# Patient Record
Sex: Female | Born: 1990 | Smoking: Never smoker
Health system: Southern US, Community
[De-identification: ages and names within clinical notes are randomized; demographics above are authoritative.]

## PROBLEM LIST (undated history)

## (undated) DIAGNOSIS — Z789 Other specified health status: Secondary | ICD-10-CM

## (undated) HISTORY — DX: Other specified health status: Z78.9

---

## 2015-05-25 NOTE — L&D Delivery Note (Signed)
Delivery Note At 8:48 PM a viable female was delivered via  (Presentation:vertex ; LOA ).  APGAR:8 ,9 ; weight  .   Placenta status:spont ,via shultz .  Cord 3vc:  with the following complications:none .  Cord pH: n/a  Anesthesia: none  Episiotomy: none  Lacerations:  none Suture Repair: n/a Est. Blood Loss 50 (mL):    Mom to postpartum.  Baby to Couplet care / Skin to Skin.  Emily Best 05/23/2016, 8:55 PM

## 2016-02-09 DIAGNOSIS — Z3493 Encounter for supervision of normal pregnancy, unspecified, third trimester: Secondary | ICD-10-CM | POA: Insufficient documentation

## 2016-02-18 ENCOUNTER — Encounter: Payer: Self-pay | Admitting: Family Medicine

## 2016-02-18 ENCOUNTER — Ambulatory Visit (INDEPENDENT_AMBULATORY_CARE_PROVIDER_SITE_OTHER): Payer: Self-pay | Admitting: Family Medicine

## 2016-02-18 VITALS — BP 101/58 | HR 72 | Ht 70.0 in | Wt 145.0 lb

## 2016-02-18 DIAGNOSIS — Z3482 Encounter for supervision of other normal pregnancy, second trimester: Secondary | ICD-10-CM

## 2016-02-18 DIAGNOSIS — Z3492 Encounter for supervision of normal pregnancy, unspecified, second trimester: Secondary | ICD-10-CM

## 2016-02-18 LAB — POCT URINALYSIS DIP (DEVICE)
BILIRUBIN URINE: NEGATIVE
GLUCOSE, UA: NEGATIVE mg/dL
Hgb urine dipstick: NEGATIVE
KETONES UR: NEGATIVE mg/dL
LEUKOCYTES UA: NEGATIVE
Nitrite: NEGATIVE
Protein, ur: NEGATIVE mg/dL
SPECIFIC GRAVITY, URINE: 1.025 (ref 1.005–1.030)
Urobilinogen, UA: 0.2 mg/dL (ref 0.0–1.0)
pH: 6.5 (ref 5.0–8.0)

## 2016-02-18 LAB — CBC
HEMATOCRIT: 32.6 % — AB (ref 35.0–45.0)
Hemoglobin: 10.6 g/dL — ABNORMAL LOW (ref 11.7–15.5)
MCH: 28.4 pg (ref 27.0–33.0)
MCHC: 32.5 g/dL (ref 32.0–36.0)
MCV: 87.4 fL (ref 80.0–100.0)
MPV: 10.3 fL (ref 7.5–12.5)
PLATELETS: 175 10*3/uL (ref 140–400)
RBC: 3.73 MIL/uL — ABNORMAL LOW (ref 3.80–5.10)
RDW: 13.1 % (ref 11.0–15.0)
WBC: 5.1 10*3/uL (ref 3.8–10.8)

## 2016-02-18 MED ORDER — PREPLUS 27-1 MG PO TABS: 1.0000 | ORAL_TABLET | Freq: Every day | ORAL | 13 refills | Status: DC

## 2016-02-18 NOTE — Addendum Note (Signed)
Addended by: Levie HeritageSTINSON, Albaro Deviney J on: 02/18/2016 11:28 AM   Modules accepted: Orders

## 2016-02-18 NOTE — Progress Notes (Addendum)
   PRENATAL VISIT NOTE  Subjective:  Emily Best is a 25 y.o. G3P2001 at 2145w2d being seen today for initial prenatal care.  She initially had [facility] in [place].  We have records, which were reviewed. Do not have PNL. Last visit there was around 20 weeks - did not have fetal survey. She is currently monitored for the following issues for this low-risk pregnancy and has Supervision of normal first pregnancy on her problem list.  Patient reports no complaints.  Contractions: Irritability. Vag. Bleeding: None.  Movement: Present. Denies leaking of fluid.   The following portions of the patient's history were reviewed and updated as appropriate: allergies, current medications, past family history, past medical history, past social history, past surgical history and problem list. Problem list updated.  Objective:   Vitals:   02/18/16 0918 02/18/16 0919  BP: (!) 101/58   Pulse: 72   Weight: 145 lb (65.8 kg)   Height:  5\' 10"  (1.778 m)    Fetal Status: Fetal Heart Rate (bpm): 143 Fundal Height: 27 cm Movement: Present     General:  Alert, oriented and cooperative. Patient is in no acute distress.  Skin: Skin is warm and dry. No rash noted.   Cardiovascular: Normal heart rate noted  Respiratory: Normal respiratory effort, no problems with respiration noted  Abdomen: Soft, gravid, appropriate for gestational age. Pain/Pressure: Present     Pelvic:  Cervical exam deferred        Extremities: Normal range of motion.  Edema: None  Mental Status: Normal mood and affect. Normal behavior. Normal judgment and thought content.   Urinalysis:      Assessment and Plan:  Pregnancy: G3P2001 at 1245w2d  1. Supervision of normal pregnancy in second trimester FHT and FH normal.  Fetal survey ordered. Attempting to get lab report for PNL. If unable to  - CBC - RPR - HIV antibody (with reflex) - Glucose Tolerance, 1 HR (50g) w/o Fasting - US MFM OB COMP + 14 WK; Future  Preterm labor symptoms and  general obstetric precautions including but not limited to vaginal bleeding, contractions, leaking of fluid and fetal movement were reviewed in detail with the patient. Please refer to After Visit Summary for other counseling recommendations.  Return in about 4 weeks (around 03/17/2016) for OB f/u.  Levie HeritageJacob J Ashland Wiseman, DO

## 2016-02-19 LAB — GLUCOSE TOLERANCE, 1 HOUR (50G) W/O FASTING: GLUCOSE, 1 HR, GESTATIONAL: 96 mg/dL (ref <140)

## 2016-02-19 LAB — HIV ANTIBODY (ROUTINE TESTING W REFLEX): HIV 1&2 Ab, 4th Generation: NONREACTIVE

## 2016-02-20 LAB — RPR

## 2016-02-23 ENCOUNTER — Other Ambulatory Visit: Payer: Self-pay | Admitting: Family Medicine

## 2016-02-23 ENCOUNTER — Ambulatory Visit (HOSPITAL_COMMUNITY)
Admission: RE | Admit: 2016-02-23 | Discharge: 2016-02-23 | Disposition: A | Discharge status: Home / Self Care | Payer: Self-pay | Source: Ambulatory Visit | Attending: Family Medicine | Admitting: Family Medicine

## 2016-02-23 DIAGNOSIS — Z3A28 28 weeks gestation of pregnancy: Secondary | ICD-10-CM

## 2016-02-23 DIAGNOSIS — O0933 Supervision of pregnancy with insufficient antenatal care, third trimester: Secondary | ICD-10-CM

## 2016-02-23 DIAGNOSIS — Z363 Encounter for antenatal screening for malformations: Secondary | ICD-10-CM

## 2016-02-23 DIAGNOSIS — Z3492 Encounter for supervision of normal pregnancy, unspecified, second trimester: Secondary | ICD-10-CM

## 2016-03-17 ENCOUNTER — Ambulatory Visit (INDEPENDENT_AMBULATORY_CARE_PROVIDER_SITE_OTHER): Payer: Self-pay | Admitting: Advanced Practice Midwife

## 2016-03-17 DIAGNOSIS — Z3403 Encounter for supervision of normal first pregnancy, third trimester: Secondary | ICD-10-CM

## 2016-03-17 LAB — POCT URINALYSIS DIP (DEVICE)
Bilirubin Urine: NEGATIVE
Glucose, UA: NEGATIVE mg/dL
HGB URINE DIPSTICK: NEGATIVE
Ketones, ur: NEGATIVE mg/dL
Leukocytes, UA: NEGATIVE
Nitrite: NEGATIVE
PH: 5.5 (ref 5.0–8.0)
PROTEIN: NEGATIVE mg/dL
UROBILINOGEN UA: 0.2 mg/dL (ref 0.0–1.0)

## 2016-03-17 NOTE — Patient Instructions (Signed)

## 2016-03-17 NOTE — Progress Notes (Signed)
States she doesn't think she is having contractions, but is having pressure and some pains in her pelvis. Called and also faxed release of records from previous provider.

## 2016-03-21 NOTE — Progress Notes (Signed)
   PRENATAL VISIT NOTE  Subjective:  Emily Best is a 25 y.o. G3P2001 at 163w2d being seen today for ongoing prenatal care.  She is currently monitored for the following issues for this low-risk pregnancy and has Supervision of normal first pregnancy on her problem list.  Patient reports pelvic pressure. Denies contractions. .  Contractions: Not present. Vag. Bleeding: None.  Movement: Present. Denies leaking of fluid.   The following portions of the patient's history were reviewed and updated as appropriate: allergies, current medications, past family history, past medical history, past social history, past surgical history and problem list. Problem list updated.  Objective:   Vitals:   03/17/16 1108  BP: 101/60  Pulse: 77  Weight: 150 lb (68 kg)    Fetal Status: Fetal Heart Rate (bpm): 134   Movement: Present     General:  Alert, oriented and cooperative. Patient is in no acute distress.  Skin: Skin is warm and dry. No rash noted.   Cardiovascular: Normal heart rate noted  Respiratory: Normal respiratory effort, no problems with respiration noted  Abdomen: Soft, gravid, appropriate for gestational age. Pain/Pressure: Present     Pelvic:  Declined        Extremities: Normal range of motion.  Edema: None  Mental Status: Normal mood and affect. Normal behavior. Normal judgment and thought content.   Assessment and Plan:  Pregnancy: G3P2001 at 637w6d  1. Encounter for supervision of normal first pregnancy in third trimester   Preterm labor symptoms and general obstetric precautions including but not limited to vaginal bleeding, contractions, leaking of fluid and fetal movement were reviewed in detail with the patient. Please refer to After Visit Summary for other counseling recommendations.  F/U 2 weeks  Dorathy KinsmanVirginia Charika Mikelson, [place]

## 2016-04-02 ENCOUNTER — Ambulatory Visit (INDEPENDENT_AMBULATORY_CARE_PROVIDER_SITE_OTHER): Payer: Self-pay | Admitting: Family Medicine

## 2016-04-02 VITALS — BP 102/56 | HR 79 | Wt 156.1 lb

## 2016-04-02 DIAGNOSIS — Z3403 Encounter for supervision of normal first pregnancy, third trimester: Secondary | ICD-10-CM

## 2016-04-02 NOTE — Addendum Note (Signed)
Addended by: Faythe CasaBELLAMY, JEANETTA M on: 04/02/2016 12:03 PM   Modules accepted: Orders

## 2016-04-02 NOTE — Progress Notes (Signed)
   PRENATAL VISIT NOTE  Subjective:  Emily BoschDominique Best is a 25 y.o. G3P2001 at 409w4d being seen today for ongoing prenatal care.  She is currently monitored for the following issues for this low-risk pregnancy and has Supervision of normal first pregnancy on her problem list.  Patient reports heartburn.  Contractions: Not present. Vag. Bleeding: None.  Movement: Present. Denies leaking of fluid.   The following portions of the patient's history were reviewed and updated as appropriate: allergies, current medications, past family history, past medical history, past social history, past surgical history and problem list. Problem list updated.  Objective:   Vitals:   04/02/16 1110  BP: (!) 102/56  Pulse: 79  Weight: 156 lb 1.6 oz (70.8 kg)    Fetal Status: Fetal Heart Rate (bpm): 135 Fundal Height: 34 cm Movement: Present     General:  Alert, oriented and cooperative. Patient is in no acute distress.  Skin: Skin is warm and dry. No rash noted.   Cardiovascular: Normal heart rate noted  Respiratory: Normal respiratory effort, no problems with respiration noted  Abdomen: Soft, gravid, appropriate for gestational age. Pain/Pressure: Present     Pelvic:  Cervical exam deferred        Extremities: Normal range of motion.  Edema: None  Mental Status: Normal mood and affect. Normal behavior. Normal judgment and thought content.   Assessment and Plan:  Pregnancy: G3P2001 at 189w4d  1. Encounter for supervision of normal first pregnancy in third trimester FHT and FH normal. Recommended Tums or OTC acid reducers for heartburn. Will get PNL as don't have records.  Preterm labor symptoms and general obstetric precautions including but not limited to vaginal bleeding, contractions, leaking of fluid and fetal movement were reviewed in detail with the patient. Please refer to After Visit Summary for other counseling recommendations.  Return in about 2 weeks (around 04/16/2016).   Levie HeritageJacob J Yesenia Locurto,  DO

## 2016-04-03 LAB — ABO

## 2016-04-03 LAB — HEPATITIS B SURFACE ANTIGEN: Hepatitis B Surface Ag: NEGATIVE

## 2016-04-03 LAB — ANTIBODY SCREEN: Antibody Screen: NEGATIVE

## 2016-04-03 LAB — RH TYPE: Rh Type: POSITIVE

## 2016-04-06 LAB — HEMOGLOBINOPATHY EVALUATION
HEMATOCRIT: 32.5 % — AB (ref 35.0–45.0)
HEMOGLOBIN: 10.5 g/dL — AB (ref 11.7–15.5)
Hgb A2 Quant: 2.3 % (ref 1.8–3.5)
Hgb A: 96.7 % (ref >96.0)
Hgb F Quant: <1 % (ref <2.0)
MCH: 28.1 pg (ref 27.0–33.0)
MCV: 86.9 fL (ref 80.0–100.0)
RBC: 3.74 MIL/uL — AB (ref 3.80–5.10)
RDW: 13.5 % (ref 11.0–15.0)

## 2016-04-20 ENCOUNTER — Ambulatory Visit (INDEPENDENT_AMBULATORY_CARE_PROVIDER_SITE_OTHER): Payer: Self-pay | Admitting: Student

## 2016-04-20 VITALS — BP 102/57 | HR 84 | Wt 157.8 lb

## 2016-04-20 DIAGNOSIS — Z3493 Encounter for supervision of normal pregnancy, unspecified, third trimester: Secondary | ICD-10-CM

## 2016-04-20 DIAGNOSIS — Z3483 Encounter for supervision of other normal pregnancy, third trimester: Secondary | ICD-10-CM

## 2016-04-20 DIAGNOSIS — Z113 Encounter for screening for infections with a predominantly sexual mode of transmission: Secondary | ICD-10-CM

## 2016-04-20 LAB — OB RESULTS CONSOLE GBS: GBS: POSITIVE

## 2016-04-20 LAB — OB RESULTS CONSOLE GC/CHLAMYDIA: GC PROBE AMP, GENITAL: NEGATIVE

## 2016-04-20 NOTE — Patient Instructions (Signed)
Introduction Patient Name: ________________________________________________ Patient Due Date: ____________________ What is a fetal movement count? A fetal movement count is the number of times that you feel your baby move during a certain amount of time. This may also be called a fetal kick count. A fetal movement count is recommended for every pregnant woman. You may be asked to start counting fetal movements as early as week 28 of your pregnancy. Pay attention to when your baby is most active. You may notice your baby's sleep and wake cycles. You may also notice things that make your baby move more. You should do a fetal movement count:  When your baby is normally most active.  At the same time each day. A good time to count movements is while you are resting, after having something to eat and drink. How do I count fetal movements? 1. Find a quiet, comfortable area. Sit, or lie down on your side. 2. Write down the date, the start time and stop time, and the number of movements that you felt between those two times. Take this information with you to your health care visits. 3. For 2 hours, count kicks, flutters, swishes, rolls, and jabs. You should feel at least 10 movements during 2 hours. 4. You may stop counting after you have felt 10 movements. 5. If you do not feel 10 movements in 2 hours, have something to eat and drink. Then, keep resting and counting for 1 hour. If you feel at least 4 movements during that hour, you may stop counting. Contact a health care provider if:  You feel fewer than 4 movements in 2 hours.  Your baby is not moving like he or she usually does. Date: ____________ Start time: ____________ Stop time: ____________ Movements: ____________ Date: ____________ Start time: ____________ Stop time: ____________ Movements: ____________ Date: ____________ Start time: ____________ Stop time: ____________ Movements: ____________ Date: ____________ Start time: ____________  Stop time: ____________ Movements: ____________ Date: ____________ Start time: ____________ Stop time: ____________ Movements: ____________ Date: ____________ Start time: ____________ Stop time: ____________ Movements: ____________ Date: ____________ Start time: ____________ Stop time: ____________ Movements: ____________ Date: ____________ Start time: ____________ Stop time: ____________ Movements: ____________ Date: ____________ Start time: ____________ Stop time: ____________ Movements: ____________ This information is not intended to replace advice given to you by your health care provider. Make sure you discuss any questions you have with your health care provider. Document Released: 06/09/2006 Document Revised: 01/07/2016 Document Reviewed: 06/19/2015 Elsevier Interactive Patient Education  2017 Elsevier Inc.  

## 2016-04-20 NOTE — Addendum Note (Signed)
Addended by: Gerome ApleyZEYFANG, LINDA L on: 04/20/2016 03:53 PM   Modules accepted: Orders

## 2016-04-20 NOTE — Progress Notes (Signed)
   PRENATAL VISIT NOTE  Subjective:  Emily Best is a 25 y.o. G3P2001 at 406w1d being seen today for ongoing prenatal care.  She is currently monitored for the following issues for this low-risk pregnancy and has Encounter for supervision of normal pregnancy in third trimester on her problem list.  Patient reports no complaints.  Contractions: Not present. Vag. Bleeding: None.  Movement: Present. Denies leaking of fluid.   The following portions of the patient's history were reviewed and updated as appropriate: allergies, current medications, past family history, past medical history, past social history, past surgical history and problem list. Problem list updated.  Objective:   Vitals:   04/20/16 1437  BP: (!) 102/57  Pulse: 84  Weight: 71.6 kg (157 lb 12.8 oz)    Fetal Status: Fetal Heart Rate (bpm): 140   Movement: Present     General:  Alert, oriented and cooperative. Patient is in no acute distress.  Skin: Skin is warm and dry. No rash noted.   Cardiovascular: Normal heart rate noted  Respiratory: Normal respiratory effort, no problems with respiration noted  Abdomen: Soft, gravid, appropriate for gestational age. Pain/Pressure: Absent     Pelvic:  Cervical exam performed        Extremities: Normal range of motion.  Edema: None  Mental Status: Normal mood and affect. Normal behavior. Normal judgment and thought content.   Assessment and Plan:  Pregnancy: G3P2001 at 326w1d  1. Prenatal care in third trimester  - GC/Chlamydia Probe Amp - Culture, beta strep (group b only) - Rubella Antibody, IGM - Culture, OB Urine  2. Encounter for supervision of other normal pregnancy in third trimester Cervix is soft, closed and fetal head is ballotable. Reviewed fetal movement and when to come to MAU.   Term labor symptoms and general obstetric precautions including but not limited to vaginal bleeding, contractions, leaking of fluid and fetal movement were reviewed in detail with  the patient. Please refer to After Visit Summary for other counseling recommendations.  Return in about 1 week (around 04/27/2016).   Marylene LandKathryn Lorraine Kooistra, CNM

## 2016-04-21 LAB — GC/CHLAMYDIA PROBE AMP (~~LOC~~) NOT AT ARMC
Chlamydia: NEGATIVE
Neisseria Gonorrhea: NEGATIVE

## 2016-04-22 LAB — CULTURE, OB URINE
COLONY COUNT: NO GROWTH
Organism ID, Bacteria: NO GROWTH

## 2016-04-22 LAB — CULTURE, BETA STREP (GROUP B ONLY)

## 2016-04-28 ENCOUNTER — Encounter: Payer: Self-pay | Admitting: Medical

## 2016-05-05 ENCOUNTER — Ambulatory Visit (INDEPENDENT_AMBULATORY_CARE_PROVIDER_SITE_OTHER): Payer: Self-pay | Admitting: Medical

## 2016-05-05 VITALS — BP 116/64 | HR 89 | Wt 159.6 lb

## 2016-05-05 DIAGNOSIS — Z3493 Encounter for supervision of normal pregnancy, unspecified, third trimester: Secondary | ICD-10-CM

## 2016-05-05 NOTE — Progress Notes (Signed)
   PRENATAL VISIT NOTE  Subjective:  Emily Best is a 25 y.o. G3P2001 at 254w2d being seen today for ongoing prenatal care.  She is currently monitored for the following issues for this low-risk pregnancy and has Encounter for supervision of normal pregnancy in third trimester on her problem list.  Patient reports no complaints.  Contractions: Not present. Vag. Bleeding: None.  Movement: Present. Denies leaking of fluid.   The following portions of the patient's history were reviewed and updated as appropriate: allergies, current medications, past family history, past medical history, past social history, past surgical history and problem list. Problem list updated.  Objective:   Vitals:   05/05/16 1132  BP: 116/64  Pulse: 89  Weight: 159 lb 9.6 oz (72.4 kg)    Fetal Status: Fetal Heart Rate (bpm): 135 Fundal Height: 38 cm Movement: Present  Presentation: Vertex  General:  Alert, oriented and cooperative. Patient is in no acute distress.  Skin: Skin is warm and dry. No rash noted.   Cardiovascular: Normal heart rate noted  Respiratory: Normal respiratory effort, no problems with respiration noted  Abdomen: Soft, gravid, appropriate for gestational age. Pain/Pressure: Present     Pelvic:  Cervical exam performed Dilation: 1 Effacement (%): 50 Station: -3  Extremities: Normal range of motion.  Edema: None  Mental Status: Normal mood and affect. Normal behavior. Normal judgment and thought content.   Assessment and Plan:  Pregnancy: G3P2001 at 514w2d  1. Supervision of low-risk pregnancy, third trimester - Rubella Antibody, IGM (not obtained at previous visits) - Discussed + GBS status and need for antibiotics in labor  Term labor symptoms and general obstetric precautions including but not limited to vaginal bleeding, contractions, leaking of fluid and fetal movement were reviewed in detail with the patient. Please refer to After Visit Summary for other counseling  recommendations.  Return in about 1 week (around 05/12/2016) for LOB.   Marny LowensteinJulie N Makylie Rivere, PA-C

## 2016-05-05 NOTE — Patient Instructions (Signed)
Introduction Patient Name: ________________________________________________ Patient Due Date: ____________________ What is a fetal movement count? A fetal movement count is the number of times that you feel your baby move during a certain amount of time. This may also be called a fetal kick count. A fetal movement count is recommended for every pregnant woman. You may be asked to start counting fetal movements as early as week 28 of your pregnancy. Pay attention to when your baby is most active. You may notice your baby's sleep and wake cycles. You may also notice things that make your baby move more. You should do a fetal movement count:  When your baby is normally most active.  At the same time each day. A good time to count movements is while you are resting, after having something to eat and drink. How do I count fetal movements? 1. Find a quiet, comfortable area. Sit, or lie down on your side. 2. Write down the date, the start time and stop time, and the number of movements that you felt between those two times. Take this information with you to your health care visits. 3. For 2 hours, count kicks, flutters, swishes, rolls, and jabs. You should feel at least 10 movements during 2 hours. 4. You may stop counting after you have felt 10 movements. 5. If you do not feel 10 movements in 2 hours, have something to eat and drink. Then, keep resting and counting for 1 hour. If you feel at least 4 movements during that hour, you may stop counting. Contact a health care provider if:  You feel fewer than 4 movements in 2 hours.  Your baby is not moving like he or she usually does. Date: ____________ Start time: ____________ Stop time: ____________ Movements: ____________ Date: ____________ Start time: ____________ Stop time: ____________ Movements: ____________ Date: ____________ Start time: ____________ Stop time: ____________ Movements: ____________ Date: ____________ Start time: ____________  Stop time: ____________ Movements: ____________ Date: ____________ Start time: ____________ Stop time: ____________ Movements: ____________ Date: ____________ Start time: ____________ Stop time: ____________ Movements: ____________ Date: ____________ Start time: ____________ Stop time: ____________ Movements: ____________ Date: ____________ Start time: ____________ Stop time: ____________ Movements: ____________ Date: ____________ Start time: ____________ Stop time: ____________ Movements: ____________ This information is not intended to replace advice given to you by your health care provider. Make sure you discuss any questions you have with your health care provider. Document Released: 06/09/2006 Document Revised: 01/07/2016 Document Reviewed: 06/19/2015 Elsevier Interactive Patient Education  2017 Else[employer] Inc[employer] Braxton Hicks Contractions Contractions of the uterus can occur throughout pregnancy. Contractions are not always a sign that you are in labor.  WHAT ARE BRAXTON HICKS CONTRACTIONS?  Contractions that occur before labor are called Braxton Hicks contractions, or false labor. Toward the end of pregnancy (32-34 weeks), these contractions can develop more often and may become more forceful. This is not true labor because these contractions do not result in opening (dilatation) and thinning of the cervix. They are sometimes difficult to tell apart from true labor because these contractions can be forceful and people have different pain tolerances. You should not feel embarrassed if you go to the hospital with false labor. Sometimes, the only way to tell if you are in true labor is for your health care provider to look for changes in the cervix. If there are no prenatal problems or other health problems associated with the pregnancy, it is completely safe to be sent home with false labor and await the onset of true labor.   HOW CAN YOU TELL THE DIFFERENCE BETWEEN TRUE AND FALSE LABOR? False Labor     The contractions of false labor are usually shorter and not as hard as those of true labor.   The contractions are usually irregular.   The contractions are often felt in the front of the lower abdomen and in the groin.   The contractions may go away when you walk around or change positions while lying down.   The contractions get weaker and are shorter lasting as time goes on.   The contractions do not usually become progressively stronger, regular, and closer together as with true labor.  True Labor   Contractions in true labor last 30-70 seconds, become very regular, usually become more intense, and increase in frequency.   The contractions do not go away with walking.   The discomfort is usually felt in the top of the uterus and spreads to the lower abdomen and low back.   True labor can be determined by your health care provider with an exam. This will show that the cervix is dilating and getting thinner.  WHAT TO REMEMBER  Keep up with your usual exercises and follow other instructions given by your health care provider.   Take medicines as directed by your health care provider.   Keep your regular prenatal appointments.   Eat and drink lightly if you think you are going into labor.   If Braxton Hicks contractions are making you uncomfortable:   Change your position from lying down or resting to walking, or from walking to resting.   Sit and rest in a tub of warm water.   Drink 2-3 glasses of water. Dehydration may cause these contractions.   Do slow and deep breathing several times an hour.  WHEN SHOULD I SEEK IMMEDIATE MEDICAL CARE? Seek immediate medical care if:  Your contractions become stronger, more regular, and closer together.   You have fluid leaking or gushing from your vagina.   You have a fever.   You pass blood-tinged mucus.   You have vaginal bleeding.   You have continuous abdominal pain.   You have low back pain  that you never had before.   You feel your baby's head pushing down and causing pelvic pressure.   Your baby is not moving as much as it used to.  This information is not intended to replace advice given to you by your health care provider. Make sure you discuss any questions you have with your health care provider. Document Released: 05/10/2005 Document Revised: 09/01/2015 Document Reviewed: 02/19/2013 Elsevier Interactive Patient Education  2017 [employer] Inc.  

## 2016-05-08 LAB — RUBELLA ANTIBODY, IGM

## 2016-05-12 ENCOUNTER — Ambulatory Visit (INDEPENDENT_AMBULATORY_CARE_PROVIDER_SITE_OTHER): Payer: Self-pay | Admitting: Student

## 2016-05-12 VITALS — BP 97/60 | HR 73 | Wt 160.0 lb

## 2016-05-12 DIAGNOSIS — O9989 Other specified diseases and conditions complicating pregnancy, childbirth and the puerperium: Secondary | ICD-10-CM

## 2016-05-12 DIAGNOSIS — O09899 Supervision of other high risk pregnancies, unspecified trimester: Secondary | ICD-10-CM

## 2016-05-12 DIAGNOSIS — Z2839 Other underimmunization status: Secondary | ICD-10-CM | POA: Insufficient documentation

## 2016-05-12 DIAGNOSIS — Z283 Underimmunization status: Secondary | ICD-10-CM

## 2016-05-12 DIAGNOSIS — Z3483 Encounter for supervision of other normal pregnancy, third trimester: Secondary | ICD-10-CM

## 2016-05-12 NOTE — Patient Instructions (Signed)
Braxton Hicks Contractions °Contractions of the uterus can occur throughout pregnancy. Contractions are not always a sign that you are in labor.  °WHAT ARE BRAXTON HICKS CONTRACTIONS?  °Contractions that occur before labor are called Braxton Hicks contractions, or false labor. Toward the end of pregnancy (32-34 weeks), these contractions can develop more often and may become more forceful. This is not true labor because these contractions do not result in opening (dilatation) and thinning of the cervix. They are sometimes difficult to tell apart from true labor because these contractions can be forceful and people have different pain tolerances. You should not feel embarrassed if you go to the hospital with false labor. Sometimes, the only way to tell if you are in true labor is for your health care provider to look for changes in the cervix. °If there are no prenatal problems or other health problems associated with the pregnancy, it is completely safe to be sent home with false labor and await the onset of true labor. °HOW CAN YOU TELL THE DIFFERENCE BETWEEN TRUE AND FALSE LABOR? °False Labor  °· The contractions of false labor are usually shorter and not as hard as those of true labor.   °· The contractions are usually irregular.   °· The contractions are often felt in the front of the lower abdomen and in the groin.   °· The contractions may go away when you walk around or change positions while lying down.   °· The contractions get weaker and are shorter lasting as time goes on.   °· The contractions do not usually become progressively stronger, regular, and closer together as with true labor.   °True Labor  °· Contractions in true labor last 30-70 seconds, become very regular, usually become more intense, and increase in frequency.   °· The contractions do not go away with walking.   °· The discomfort is usually felt in the top of the uterus and spreads to the lower abdomen and low back.   °· True labor can be  determined by your health care provider with an exam. This will show that the cervix is dilating and getting thinner.   °WHAT TO REMEMBER °· Keep up with your usual exercises and follow other instructions given by your health care provider.   °· Take medicines as directed by your health care provider.   °· Keep your regular prenatal appointments.   °· Eat and drink lightly if you think you are going into labor.   °· If Braxton Hicks contractions are making you uncomfortable:   °¨ Change your position from lying down or resting to walking, or from walking to resting.   °¨ Sit and rest in a tub of warm water.   °¨ Drink 2-3 glasses of water. Dehydration may cause these contractions.   °¨ Do slow and deep breathing several times an hour.   °WHEN SHOULD I SEEK IMMEDIATE MEDICAL CARE? °Seek immediate medical care if: °· Your contractions become stronger, more regular, and closer together.   °· You have fluid leaking or gushing from your vagina.   °· You have a fever.   °· You pass blood-tinged mucus.   °· You have vaginal bleeding.   °· You have continuous abdominal pain.   °· You have low back pain that you never had before.   °· You feel your baby's head pushing down and causing pelvic pressure.   °· Your baby is not moving as much as it used to.   °This information is not intended to replace advice given to you by your health care provider. Make sure you discuss any questions you have with your health care   provider. °Document Released: 05/10/2005 Document Revised: 09/01/2015 Document Reviewed: 02/19/2013 °Elsevier Interactive Patient Education © 2017 [employer] ° °

## 2016-05-12 NOTE — Progress Notes (Signed)
   PRENATAL VISIT NOTE  Subjective:  Emily Best is a 25 y.o. G3P2001 at 6550w2d being seen today for ongoing prenatal care.  She is currently monitored for the following issues for this low-risk pregnancy and has Encounter for supervision of normal pregnancy in third trimester on her problem list.  Patient reports no complaints.  Contractions: Not present. Vag. Bleeding: None.  Movement: Present. Denies leaking of fluid.   The following portions of the patient's history were reviewed and updated as appropriate: allergies, current medications, past family history, past medical history, past social history, past surgical history and problem list. Problem list updated.  Objective:   Vitals:   05/12/16 1148  BP: 97/60  Pulse: 73  Weight: 160 lb (72.6 kg)    Fetal Status: Fetal Heart Rate (bpm): 142 Fundal Height: 39 cm Movement: Present     General:  Alert, oriented and cooperative. Patient is in no acute distress.  Skin: Skin is warm and dry. No rash noted.   Cardiovascular: Normal heart rate noted  Respiratory: Normal respiratory effort, no problems with respiration noted  Abdomen: Soft, gravid, appropriate for gestational age. Pain/Pressure: Absent     Pelvic:  Cervical exam performed        Extremities: Normal range of motion.  Edema: None  Mental Status: Normal mood and affect. Normal behavior. Normal judgment and thought content.   Assessment and Plan:  Pregnancy: G3P2001 at 5950w2d  1. Encounter for supervision of other normal pregnancy in third trimester Patient has no complaints, cervix is 50%, closed and ballotable.   Term labor symptoms and general obstetric precautions including but not limited to vaginal bleeding, contractions, leaking of fluid and fetal movement were reviewed in detail with the patient.  2. Patient is rubella non-immune, will need rubella vaccine post partum.   Please refer to After Visit Summary for other counseling recommendations.  No Follow-up  on file.   Marylene LandKathryn Lorraine Zalika Tieszen, CNM

## 2016-05-19 ENCOUNTER — Encounter: Payer: Self-pay | Admitting: Advanced Practice Midwife

## 2016-05-20 ENCOUNTER — Telehealth (HOSPITAL_COMMUNITY): Payer: Self-pay | Admitting: *Deleted

## 2016-05-20 ENCOUNTER — Encounter: Payer: Self-pay | Admitting: Obstetrics and Gynecology

## 2016-05-20 ENCOUNTER — Ambulatory Visit (INDEPENDENT_AMBULATORY_CARE_PROVIDER_SITE_OTHER): Payer: Self-pay | Admitting: Obstetrics and Gynecology

## 2016-05-20 ENCOUNTER — Ambulatory Visit: Payer: Self-pay

## 2016-05-20 VITALS — BP 106/67 | HR 81 | Wt 161.7 lb

## 2016-05-20 DIAGNOSIS — Z3689 Encounter for other specified antenatal screening: Secondary | ICD-10-CM

## 2016-05-20 DIAGNOSIS — O480 Post-term pregnancy: Secondary | ICD-10-CM

## 2016-05-20 DIAGNOSIS — O99820 Streptococcus B carrier state complicating pregnancy: Secondary | ICD-10-CM

## 2016-05-20 NOTE — Telephone Encounter (Signed)
Preadmission screen  

## 2016-05-20 NOTE — Progress Notes (Addendum)
Prenatal Visit Note Date: 05/20/2016 Clinic: [facility]  Subjective:  Emily BoschDominique Best is a 25 y.o. G3P2001 at 9448w3d being seen today for ongoing prenatal care.  She is currently monitored for the following issues for this low-risk pregnancy and has Encounter for supervision of normal pregnancy in third trimester; Rubella non-immune status, antepartum; and GBS (group B Streptococcus carrier), +RV culture, currently pregnant on her problem list.  Patient reports no complaints.   Contractions: Not present. Vag. Bleeding: None.  Movement: Present. Denies leaking of fluid.   The following portions of the patient's history were reviewed and updated as appropriate: allergies, current medications, past family history, past medical history, past social history, past surgical history and problem list. Problem list updated.  Objective:   Vitals:   05/20/16 1303  BP: 106/67  Pulse: 81  Weight: 161 lb 11.2 oz (73.3 kg)    Fetal Status: Fetal Heart Rate (bpm): 134 Fundal Height: 38 cm Movement: Present  Presentation: Vertex  General:  Alert, oriented and cooperative. Patient is in no acute distress.  Skin: Skin is warm and dry. No rash noted.   Cardiovascular: Normal heart rate noted  Respiratory: Normal respiratory effort, no problems with respiration noted  Abdomen: Soft, gravid, appropriate for gestational age. Pain/Pressure: Present     Pelvic:  2/50/-1/midposition/soft.   Extremities: Normal range of motion.  Edema: None  Mental Status: Normal mood and affect. Normal behavior. Normal judgment and thought content.   Urinalysis:      Assessment and Plan:  Pregnancy: G3P2001 at 5248w3d  1. GBS (group B Streptococcus carrier), +RV culture, currently pregnant tx in labor.  2. Post term pregnancy, antepartum condition or complication Set up for IOL on 12/31. Normal AFI and rNST x 365m (125 baseline, +accels, no decel, mod var, +UCs and irritability). Likely pitocin. Depo  provera  Term labor symptoms and general obstetric precautions including but not limited to vaginal bleeding, contractions, leaking of fluid and fetal movement were reviewed in detail with the patient. Please refer to After Visit Summary for other counseling recommendations.  Return in about 6 weeks (around 07/01/2016) for PP visit.    Bingharlie Ieisha Gao, MD

## 2016-05-20 NOTE — Progress Notes (Signed)
Pt informed that the ultrasound today is considered a limited OB ultrasound and is not intended to be a complete ultrasound exam.  Patient also informed that the ultrasound is not being completed with the intent of assessing for fetal or placental anomalies or any pelvic abnormalities.  Explained that the purpose of today's ultrasound is to assess for presentation and amniotic fluid volume.  Patient acknowledges the purpose of the exam and the limitations of the study.

## 2016-05-23 ENCOUNTER — Inpatient Hospital Stay (HOSPITAL_COMMUNITY)
Admission: RE | Admit: 2016-05-23 | Discharge: 2016-05-25 | DRG: 775 | Disposition: A | Discharge status: Home / Self Care | Payer: Self-pay | Source: Ambulatory Visit | Attending: Obstetrics and Gynecology | Admitting: Obstetrics and Gynecology

## 2016-05-23 ENCOUNTER — Encounter (HOSPITAL_COMMUNITY): Payer: Self-pay

## 2016-05-23 DIAGNOSIS — Z3A40 40 weeks gestation of pregnancy: Secondary | ICD-10-CM

## 2016-05-23 DIAGNOSIS — O99824 Streptococcus B carrier state complicating childbirth: Secondary | ICD-10-CM

## 2016-05-23 DIAGNOSIS — O480 Post-term pregnancy: Principal | ICD-10-CM | POA: Diagnosis present

## 2016-05-23 LAB — TYPE AND SCREEN
ABO/RH(D): O POS
ANTIBODY SCREEN: NEGATIVE

## 2016-05-23 LAB — CBC
HCT: 30.1 % — ABNORMAL LOW (ref 36.0–46.0)
Hemoglobin: 10 g/dL — ABNORMAL LOW (ref 12.0–15.0)
MCH: 27.3 pg (ref 26.0–34.0)
MCHC: 33.2 g/dL (ref 30.0–36.0)
MCV: 82.2 fL (ref 78.0–100.0)
PLATELETS: 175 10*3/uL (ref 150–400)
RBC: 3.66 MIL/uL — ABNORMAL LOW (ref 3.87–5.11)
RDW: 14 % (ref 11.5–15.5)
WBC: 6.4 10*3/uL (ref 4.0–10.5)

## 2016-05-23 LAB — RPR: RPR: NONREACTIVE

## 2016-05-23 LAB — ABO/RH: ABO/RH(D): O POS

## 2016-05-23 MED ORDER — FAMOTIDINE 20 MG PO TABS
20.0000 mg | ORAL_TABLET | Freq: Every day | ORAL | Status: DC
  Administered 2016-05-25: 20 mg via ORAL
  Filled 2016-05-23 (×2): qty 1

## 2016-05-23 MED ORDER — DIBUCAINE 1 % RE OINT: 1.0000 "application " | TOPICAL_OINTMENT | RECTAL | Status: DC | PRN

## 2016-05-23 MED ORDER — FENTANYL CITRATE (PF) 100 MCG/2ML IJ SOLN
50.0000 ug | INTRAMUSCULAR | Status: DC | PRN
  Administered 2016-05-23 (×2): 100 ug via INTRAVENOUS
  Filled 2016-05-23 (×2): qty 2

## 2016-05-23 MED ORDER — OXYCODONE-ACETAMINOPHEN 5-325 MG PO TABS: 1.0000 | ORAL_TABLET | ORAL | Status: DC | PRN

## 2016-05-23 MED ORDER — DIPHENHYDRAMINE HCL 25 MG PO CAPS: 25.0000 mg | ORAL_CAPSULE | Freq: Four times a day (QID) | ORAL | Status: DC | PRN

## 2016-05-23 MED ORDER — PENICILLIN G POT IN DEXTROSE 60000 UNIT/ML IV SOLN
3.0000 10*6.[IU] | INTRAVENOUS | Status: DC
  Administered 2016-05-23 (×2): 3 10*6.[IU] via INTRAVENOUS
  Filled 2016-05-23 (×4): qty 50

## 2016-05-23 MED ORDER — ACETAMINOPHEN 325 MG PO TABS: 650.0000 mg | ORAL_TABLET | ORAL | Status: DC | PRN

## 2016-05-23 MED ORDER — BENZOCAINE-MENTHOL 20-0.5 % EX AERO: 1.0000 "application " | INHALATION_SPRAY | CUTANEOUS | Status: DC | PRN

## 2016-05-23 MED ORDER — OXYTOCIN 40 UNITS IN LACTATED RINGERS INFUSION - SIMPLE MED
2.5000 [IU]/h | INTRAVENOUS | Status: DC
  Filled 2016-05-23: qty 1000

## 2016-05-23 MED ORDER — ONDANSETRON HCL 4 MG/2ML IJ SOLN: 4.0000 mg | Freq: Four times a day (QID) | INTRAMUSCULAR | Status: DC | PRN

## 2016-05-23 MED ORDER — OXYTOCIN BOLUS FROM INFUSION
500.0000 mL | Freq: Once | INTRAVENOUS | Status: AC
  Administered 2016-05-23: 500 mL via INTRAVENOUS

## 2016-05-23 MED ORDER — SODIUM CHLORIDE 0.9 % IV SOLN: 250.0000 mL | INTRAVENOUS | Status: DC | PRN

## 2016-05-23 MED ORDER — ONDANSETRON HCL 4 MG PO TABS: 4.0000 mg | ORAL_TABLET | ORAL | Status: DC | PRN

## 2016-05-23 MED ORDER — FLEET ENEMA 7-19 GM/118ML RE ENEM: 1.0000 | ENEMA | RECTAL | Status: DC | PRN

## 2016-05-23 MED ORDER — SODIUM CHLORIDE 0.9% FLUSH: 3.0000 mL | INTRAVENOUS | Status: DC | PRN

## 2016-05-23 MED ORDER — LIDOCAINE HCL (PF) 1 % IJ SOLN
30.0000 mL | INTRAMUSCULAR | Status: DC | PRN
  Filled 2016-05-23: qty 30

## 2016-05-23 MED ORDER — LACTATED RINGERS IV SOLN
INTRAVENOUS | Status: DC
  Administered 2016-05-23 (×2): 125 mL/h via INTRAVENOUS

## 2016-05-23 MED ORDER — TERBUTALINE SULFATE 1 MG/ML IJ SOLN: 0.2500 mg | Freq: Once | INTRAMUSCULAR | Status: DC | PRN

## 2016-05-23 MED ORDER — MEASLES, MUMPS & RUBELLA VAC ~~LOC~~ INJ
0.5000 mL | INJECTION | Freq: Once | SUBCUTANEOUS | Status: DC
  Filled 2016-05-23: qty 0.5

## 2016-05-23 MED ORDER — LACTATED RINGERS IV SOLN: 500.0000 mL | INTRAVENOUS | Status: DC | PRN

## 2016-05-23 MED ORDER — IBUPROFEN 600 MG PO TABS
600.0000 mg | ORAL_TABLET | Freq: Four times a day (QID) | ORAL | Status: DC
  Administered 2016-05-24 – 2016-05-25 (×8): 600 mg via ORAL
  Filled 2016-05-23 (×8): qty 1

## 2016-05-23 MED ORDER — ZOLPIDEM TARTRATE 5 MG PO TABS
5.0000 mg | ORAL_TABLET | Freq: Every evening | ORAL | Status: DC | PRN
Start: 2016-05-23 — End: 2016-05-25

## 2016-05-23 MED ORDER — TETANUS-DIPHTH-ACELL PERTUSSIS 5-2.5-18.5 LF-MCG/0.5 IM SUSP
0.5000 mL | Freq: Once | INTRAMUSCULAR | Status: DC
  Filled 2016-05-23: qty 0.5

## 2016-05-23 MED ORDER — DEXTROSE 5 % IV SOLN
5.0000 10*6.[IU] | Freq: Once | INTRAVENOUS | Status: AC
  Administered 2016-05-23: 5 10*6.[IU] via INTRAVENOUS
  Filled 2016-05-23: qty 5

## 2016-05-23 MED ORDER — SODIUM CHLORIDE 0.9% FLUSH: 3.0000 mL | Freq: Two times a day (BID) | INTRAVENOUS | Status: DC

## 2016-05-23 MED ORDER — ONDANSETRON HCL 4 MG/2ML IJ SOLN: 4.0000 mg | INTRAMUSCULAR | Status: DC | PRN

## 2016-05-23 MED ORDER — PRENATAL MULTIVITAMIN CH
1.0000 | ORAL_TABLET | Freq: Every day | ORAL | Status: DC
  Administered 2016-05-24 – 2016-05-25 (×2): 1 via ORAL
  Filled 2016-05-23 (×2): qty 1

## 2016-05-23 MED ORDER — OXYCODONE-ACETAMINOPHEN 5-325 MG PO TABS: 2.0000 | ORAL_TABLET | ORAL | Status: DC | PRN

## 2016-05-23 MED ORDER — COCONUT OIL OIL: 1.0000 "application " | TOPICAL_OIL | Status: DC | PRN

## 2016-05-23 MED ORDER — OXYTOCIN 40 UNITS IN LACTATED RINGERS INFUSION - SIMPLE MED
1.0000 m[IU]/min | INTRAVENOUS | Status: DC
  Administered 2016-05-23: 2 m[IU]/min via INTRAVENOUS

## 2016-05-23 MED ORDER — SENNOSIDES-DOCUSATE SODIUM 8.6-50 MG PO TABS
2.0000 | ORAL_TABLET | ORAL | Status: DC
  Administered 2016-05-23 – 2016-05-25 (×2): 2 via ORAL
  Filled 2016-05-23 (×2): qty 2

## 2016-05-23 MED ORDER — SIMETHICONE 80 MG PO CHEW
80.0000 mg | CHEWABLE_TABLET | ORAL | Status: DC | PRN
  Administered 2016-05-25: 80 mg via ORAL

## 2016-05-23 MED ORDER — SOD CITRATE-CITRIC ACID 500-334 MG/5ML PO SOLN
30.0000 mL | ORAL | Status: DC | PRN
Start: 2016-05-23 — End: 2016-05-23

## 2016-05-23 MED ORDER — WITCH HAZEL-GLYCERIN EX PADS: 1.0000 "application " | MEDICATED_PAD | CUTANEOUS | Status: DC | PRN

## 2016-05-23 NOTE — H&P (Signed)
Emily BoschDominique Krieger is a 25 y.o. female G3P2001 @ 40.6 wks  presenting for IOL for post dates. GBS pos. OB History    Gravida Para Term Preterm AB Living   3 2 2     1    SAB TAB Ectopic Multiple Live Births           2      Obstetric Comments   One child passed away remote from birth due to drowning.      Past Medical History:  Diagnosis Date  . Medical history non-contributory    History reviewed. No pertinent surgical history. Family History: family history is not on file. Social History:  reports that she has never smoked. She has never used smokeless tobacco. She reports that she does not drink alcohol or use drugs.     Maternal Diabetes: No Genetic Screening: Normal Maternal Ultrasounds/Referrals: Normal Fetal Ultrasounds or other Referrals:  None Maternal Substance Abuse:  No Significant Maternal Medications:  None Significant Maternal Lab Results:  Lab values include: Group B Strep positive Other Comments:  None  Review of Systems  Constitutional: Negative.   HENT: Negative.   Eyes: Negative.   Respiratory: Negative.   Cardiovascular: Negative.   Gastrointestinal: Negative.   Genitourinary: Negative.   Musculoskeletal: Negative.   Skin: Negative.   Neurological: Negative.   Endo/Heme/Allergies: Negative.   Psychiatric/Behavioral: Negative.    Maternal Medical History:  Reason for admission: Post dates  Fetal activity: Perceived fetal activity is normal.   Last perceived fetal movement was within the past hour.    Prenatal complications: no prenatal complications Prenatal Complications - Diabetes: none.    Dilation: Fingertip Effacement (%): Thick Station: -3 Exam by:: D Nic Lampe CNM Blood pressure 102/79, pulse 88, temperature 97.5 F (36.4 C), temperature source Oral, resp. rate 20, height 5\' 10"  (1.778 m), weight 161 lb (73 kg), last menstrual period 08/14/2015. Maternal Exam:  Abdomen: Patient reports no abdominal tenderness. Fetal presentation:  vertex  Introitus: Normal vulva. Normal vagina.  Pelvis: adequate for delivery.   Cervix: Cervix evaluated by digital exam.     Fetal Exam Fetal Monitor Review: Mode: ultrasound.   Variability: moderate (6-25 bpm).   Pattern: accelerations present.    Fetal State Assessment: Category I - tracings are normal.     Physical Exam  Constitutional: She is oriented to person, place, and time. She appears well-developed and well-nourished.  HENT:  Head: Normocephalic.  Eyes: Pupils are equal, round, and reactive to light.  Cardiovascular: Normal rate, regular rhythm, normal heart sounds and intact distal pulses.   Respiratory: Effort normal and breath sounds normal.  GI: Soft. Bowel sounds are normal.  Genitourinary: Vagina normal and uterus normal.  Musculoskeletal: Normal range of motion.  Neurological: She is alert and oriented to person, place, and time. She has normal reflexes.  Skin: Skin is warm and dry.  Psychiatric: She has a normal mood and affect. Her behavior is normal. Judgment and thought content normal.    Prenatal labs: ABO, Rh: O/POS/-- (11/10 1136) Antibody: NEG (11/10 1136) Rubella: < 20.00 (12/13 1209) RPR: NON REAC (09/27 1030)  HBsAg: NEGATIVE (11/10 1136)  HIV: NONREACTIVE (09/27 1030)  GBS:     Assessment/Plan: Admit GBS prophylaxis  Pit induction of labor SVE ft/th/post/high   Wyvonnia DuskyMarie Hokulani Rogel 05/23/2016, 8:45 AM

## 2016-05-23 NOTE — Progress Notes (Signed)
Emily Best is a 25 y.o. G3P2001 at 10822w6d by ultrasound admitted for induction of labor due to Post dates. Due date 12/25.  Subjective:   Objective: BP 113/65   Pulse 63   Temp 97.5 F (36.4 C)   Resp 20   Ht 5\' 10"  (1.778 m)   Wt 161 lb (73 kg)   LMP 08/14/2015 (Within Days)   BMI 23.10 kg/m  No intake/output data recorded. No intake/output data recorded.  FHT:  FHR: 120 bpm, variability: moderate,  accelerations:  Present,  decelerations:  Absent UC:   regular, every 2-3 minutes SVE:   Dilation: 3 Effacement (%): 70 Station: -1 Exam by:: D Best CNM  Labs: Lab Results  Component Value Date   WBC 6.4 05/23/2016   HGB 10.0 (L) 05/23/2016   HCT 30.1 (L) 05/23/2016   MCV 82.2 05/23/2016   PLT 175 05/23/2016    Assessment / Plan: Induction of labor due to postterm,  progressing well on pitocin  Labor: Progressing normally Preeclampsia:  no signs or symptoms of toxicity and intake and ouput balanced Fetal Wellbeing:  Category I Pain Control:  Labor support without medications I/D:  n/a Anticipated MOD:  NSVD  Emily Best 05/23/2016, 6:10 PM

## 2016-05-23 NOTE — Progress Notes (Signed)
Emily BoschDominique Best is a 25 y.o. G3P2001 at 2196w6d by ultrasound admitted for induction of labor due to Post dates. Due date 05/17/16.  Subjective:   Objective: BP 114/66   Pulse 64   Temp 98.1 F (36.7 C) (Oral)   Resp (P) 15   Ht 5\' 10"  (1.778 m)   Wt 161 lb (73 kg)   LMP 08/14/2015 (Within Days)   BMI 23.10 kg/m  No intake/output data recorded. No intake/output data recorded.  FHT:  FHR: 125 bpm, variability: moderate,  accelerations:  Present,  decelerations:  Present early UC:   regular, every 2 minutes SVE:   Dilation: Fingertip Effacement (%): Thick Station: -3 Exam by:: D Jamin Panther CNM  Labs: Lab Results  Component Value Date   WBC 6.4 05/23/2016   HGB 10.0 (L) 05/23/2016   HCT 30.1 (L) 05/23/2016   MCV 82.2 05/23/2016   PLT 175 05/23/2016    Assessment / Plan: Induction of labor due to postterm,  progressing well on pitocin  Labor: Progressing normally Preeclampsia:  no signs or symptoms of toxicity and intake and ouput balanced Fetal Wellbeing:  Category I Pain Control:  Labor support without medications I/D:  n/a Anticipated MOD:  NSVD  Emily Best 05/23/2016, 1:34 PM

## 2016-05-23 NOTE — Progress Notes (Signed)
Pt sitting straight up in bed.  Monitors readjusted

## 2016-05-23 NOTE — Anesthesia Pain Management Evaluation Note (Signed)
  [facility] Pain Management Visit Note  Patient: Emily BoschDominique Best, 25 y.o., female  "Hello I am a member of the anesthesia team at [facility]. We have an anesthesia team available at all times to provide care throughout the hospital, including epidural management and anesthesia for C-section. I don't know your plan for the delivery whether it a natural birth, water birth, IV sedation, nitrous supplementation, doula or epidural, but we want to meet your pain goals."   1.Was your pain managed to your expectations on prior hospitalizations?   Yes   2.What is your expectation for pain management during this hospitalization?     Labor support without medications  3.How can we help you reach that goal? Pt states she has had two previous deliveries with no epidural and wants this process to be the same.  Record the patient's initial score and the patient's pain goal.   Pain: 0  Pain Goal: 10 [facility] wants you to be able to say your pain was always managed very well.  Laury Huizar 05/23/2016

## 2016-05-24 NOTE — Progress Notes (Signed)
Post Partum Day 1 Subjective: no complaints, up ad lib, voiding and tolerating PO  Objective: Blood pressure 110/70, pulse 81, temperature 98 F (36.7 C), temperature source Oral, resp. rate 18, height 5\' 10"  (1.778 m), weight 161 lb (73 kg), last menstrual period 08/14/2015, unknown if currently breastfeeding.  Physical Exam:  General: alert, cooperative, appears stated age and no distress Lochia: appropriate Uterine Fundus: firm Incision: n/a DVT Evaluation: No evidence of DVT seen on physical exam.   Recent Labs  05/23/16 0815  HGB 10.0*  HCT 30.1*    Assessment/Plan: Plan for discharge tomorrow   LOS: 1 day   Emily Best 05/24/2016, 8:33 AM

## 2016-05-24 NOTE — Lactation Note (Signed)
This note was copied from a baby's chart. Lactation Consultation Note  Patient Name: Emily Best HYQMV'HToday's Date: 05/24/2016 Reason for consult: Follow-up assessment Baby is 8318 hours old and has been to the breast 7- 30 mins. Per mom reports  The latches have been comfortable. Also baby was started on formula and bottle this after noon Per mom request. Per mom my boyfriend really doesn't care for me to breast feed. I breast fed 1st baby ( now 28 year old )  For 7 months, but we didn't live together then so it was easy. Now we live together so it may be harder.  LC reassured mom the [facility] would be here to support her preference, also recommended to communicate  With dad the benefits for mom and baby , especially since presently we are in the winter flu season and breast fed babies are less sick.  Per mom I really like breast feeding and I know it is Best for the baby and me.  While LC was helping mom breast feed the baby became gaggy and spitty, bulb syringe and burping needed several times, and MBU RN  Kelby Alinemily Best was made aware. Baby settled down and LC assisted mom to latch in the football position right breast and baby was still feeding  At 23 mins with multiple swallows. LC highly recommended due to the spitting to hold off on the formula due to one stool until the spitting subsided.  Also mentioned the benefits of colostrum to clean the gut out. Mom receptive, also felt comfortable to use the bulb syringe, Nurses light within hands reach.  And mom aware of the emergency light.  Mother informed of post-discharge support and given phone number to the lactation department, including services for phone call assistance; out-patient appointments; and breastfeeding support group. List of other breastfeeding resources in the community given in the handout. Encouraged mother to call for problems or concerns related to breastfeeding.  Maternal Data Has patient been taught Hand Expression?:  Yes (steady flow of colostrun noted ) Does the patient have breastfeeding experience prior to this delivery?: Yes  Feeding Feeding Type: Breast Fed Length of feed:  (baby still feeding at 23 mins , with multiple swallows )  LATCH Score/Interventions Latch: Grasps breast easily, tongue down, lips flanged, rhythmical sucking. Intervention(s): Adjust position;Assist with latch;Breast massage;Breast compression  Audible Swallowing: Spontaneous and intermittent  Type of Nipple: Everted at rest and after stimulation  Comfort (Breast/Nipple): Filling, red/small blisters or bruises, mild/mod discomfort     Hold (Positioning): Assistance needed to correctly position infant at breast and maintain latch. (worked on depth ) Intervention(s): Breastfeeding basics reviewed;Support Pillows;Position options;Skin to skin  LATCH Score: 8  Lactation Tools Discussed/Used Tools:  (mom will need a hand pump before D/C ) WIC Program: No ([facility] )   Consult Status Consult Status: Follow-up Date: 05/25/16 Follow-up type: In-patient    Matilde SprangMargaret Ann Chanae Best 05/24/2016, 3:38 PM

## 2016-05-25 MED ORDER — IBUPROFEN 600 MG PO TABS: 600.0000 mg | ORAL_TABLET | Freq: Four times a day (QID) | ORAL | 2 refills | Status: DC

## 2016-05-25 NOTE — Discharge Summary (Signed)
Obstetric Discharge Summary Reason for Admission: induction of labor and postdates at 214w6d Prenatal Procedures: NST and ultrasound Intrapartum Procedures: spontaneous vaginal delivery Postpartum Procedures: none Complications-Operative and Postpartum: none Hemoglobin  Date Value Ref Range Status  05/23/2016 10.0 (L) 12.0 - 15.0 g/dL Final   HCT  Date Value Ref Range Status  05/23/2016 30.1 (L) 36.0 - 46.0 % Final    Physical Exam:  General: alert, cooperative and no distress Lochia: appropriate Uterine Fundus: firm Incision: none DVT Evaluation: No evidence of DVT seen on physical exam. No cords or calf tenderness. No significant calf/ankle edema.  Discharge Diagnoses: Term Pregnancy-delivered  Discharge Information: Date: 05/25/2016 Activity: pelvic rest Diet: routine Medications: PNV and Ibuprofen Condition: stable Instructions: refer to practice specific booklet Discharge to: home Follow-up Information    [facility]             . Schedule an appointment as soon as possible for a visit in 6 week(s).   Why:  postpartum exam Contact information: [place]          Newborn Data: Live born female  Birth Weight: 6 lb 13.2 oz (3095 g) APGAR: 8, 9  Home with mother.  Roe Coombsachelle A Denney, CNM 05/25/2016, 7:46 AM

## 2016-05-25 NOTE — Lactation Note (Addendum)
This note was copied from a baby's chart. Lactation Consultation Note  Patient Name: Emily Best ZOXWR'UToday's Date: 05/25/2016 Reason for consult: Follow-up assessment Baby is 7039 hours old and [facility] Yesterday mom has only breast fed.  Per mom the spitting slowed down, but still seems to be gaggy some times.  Per mom mentioned it was about time for the baby to feed.  LC checked diaper and it was dry. LC placed baby STS on the right breast, added support.  Mom needed assistance and review to obtain the depth. Multiple swallows noted, increased with breast  Compressions and per mom comfortable with latch. Baby fed for 40 mins. And is presently resting on moms chest  Asleep.  Sore nipples and engorgement prevention and tx reviewed. LC instructed mom on the use of hand pump.  Checked flange size and the #24 was a good fit.  LC discussed nutritive vs non- nutritive sucking patterns and the importance of watching the feeding pattern  Intermittently to make sure the baby is participating in the feeding.  LC also stressed since this is mom 2nd baby if the 1st breast is to full to hand express for pre pump to release  Fullness to the baby will get to the creamy fat quicker and help for steady weight again. And plan on offering the 2nd breast.  Mother informed of post-discharge support and given phone number to the lactation department, including services for phone call assistance; out-patient appointments; and breastfeeding support group. List of other breastfeeding resources in the community given in the handout. Encouraged mother to call for problems or concerns related to breastfeeding.   Maternal Data Has patient been taught Hand Expression?: Yes  Feeding Feeding Type: Breast Fed Length of feed:  (multiple swallows, inc with breast compressions )  LATCH Score/Interventions Latch: Grasps breast easily, tongue down, lips flanged, rhythmical sucking. Intervention(s): Adjust  position;Assist with latch;Breast massage;Breast compression  Audible Swallowing: Spontaneous and intermittent  Type of Nipple: Everted at rest and after stimulation  Comfort (Breast/Nipple): Soft / non-tender     Hold (Positioning): Assistance needed to correctly position infant at breast and maintain latch. (worked on the depth ) Intervention(s): Breastfeeding basics reviewed;Support Pillows;Position options;Skin to skin  LATCH Score: 9  Lactation Tools Discussed/Used     Consult Status Consult Status: Follow-up Date: 05/25/16 Follow-up type: In-patient    Emily SprangMargaret Ann Kathalina Best 05/25/2016, 11:56 AM

## 2016-05-25 NOTE — Progress Notes (Signed)
Post Partum Day #2 Subjective: no complaints, up ad lib, voiding and tolerating PO  Objective: Blood pressure (!) 102/42, pulse (!) 58, temperature 98 F (36.7 C), temperature source Oral, resp. rate 19, height 5\' 10"  (1.778 m), weight 161 lb (73 kg), last menstrual period 08/14/2015, SpO2 99 %, unknown if currently breastfeeding.  Physical Exam:  General: alert, cooperative and no distress Lochia: appropriate Uterine Fundus: firm Incision: none DVT Evaluation: No evidence of DVT seen on physical exam. No cords or calf tenderness. No significant calf/ankle edema.   Recent Labs  05/23/16 0815  HGB 10.0*  HCT 30.1*    Assessment/Plan: Discharge home, Breastfeeding and Contraception DMPA   LOS: 2 days   Roe CoombsRachelle A Denney, CNM 05/25/2016, 7:42 AM

## 2016-05-25 NOTE — Lactation Note (Addendum)
This note was copied from a baby's chart. Lactation Consultation Note  Patient Name: Emily Best ZOXWR'UToday's Date: 05/25/2016 Reason for consult: Follow-up assessment (mom requested assist with latch ) 2nd LC visit today. Per mom baby doesn't latch well in football on the left breast.  LC noted baby latches well on the right breast in football. Baby probably laid on his right  Side more often in the womb. LC showed mom how to latch on the cross cradle left breast  And baby latched right on. Excellent nutritive feeding pattern. Multiple swallows, increased with breast  Compressions.  Mom requested LC O/P appt. - appt made for Thursday January 4 th @ 10:30 am. Appt. Reminder sheet given  To mom. [facility]   Maternal Data Has patient been taught Hand Expression?: Yes  Feeding Feeding Type: Breast Fed Length of feed:  (multiple swallows, inc w/ Breast compressions )  LATCH Score/Interventions Latch: Grasps breast easily, tongue down, lips flanged, rhythmical sucking. Intervention(s): Adjust position;Assist with latch;Breast massage;Breast compression  Audible Swallowing: Spontaneous and intermittent  Type of Nipple: Everted at rest and after stimulation  Comfort (Breast/Nipple): Filling, red/small blisters or bruises, mild/mod discomfort  Problem noted: Filling  Hold (Positioning): Assistance needed to correctly position infant at breast and maintain latch. Intervention(s): Breastfeeding basics reviewed;Support Pillows;Position options  LATCH Score: 8  Lactation Tools Discussed/Used Tools: Pump Breast pump type: Manual Pump Review: Setup, frequency, and cleaning Initiated by:: MAI  Date initiated:: 05/25/16   Consult Status Consult Status: Complete Date: 05/25/16    Kathrin GreathouseMargaret Ann Seleni Meller 05/25/2016, 3:53 PM

## 2016-05-25 NOTE — Progress Notes (Signed)
Rubella status questionable. Explained vaccine to patient. Patient declines MMR vaccine. Maxwell Caul, Leretha Dykes [place]

## 2016-05-27 ENCOUNTER — Ambulatory Visit (HOSPITAL_COMMUNITY): Payer: Self-pay

## 2016-05-29 ENCOUNTER — Ambulatory Visit: Payer: Self-pay

## 2016-05-29 NOTE — Lactation Note (Addendum)
This note was copied from a baby's chart. Lactation Consultation Note  Baby 366 days old.  [facility] Consult.  Baby under phototherapy. Mother pumped approx 5.5 ounces this morning at 1100. At 1000 baby was bottle fed 30 ml of breastmilk per mother. She has been primarily bottle feeding due to jaundice and to insure infant gets volume. Mother latched baby in football hold on R side.  Baby had short labial frenulum and tight small mouth. Noted a few sucks and swallows with shallow latch and baby became sleepy after 5 min. Suggest continuing to work on breastfeeding but continue to post pump and give him volume pumped in bottle. Increase volume as baby desires. Discussed sleepy feeding behavior of jaundiced infants and recommend mother be proactive with feedings and waking baby if needed q 2-3 hrs. Mother using DEBP in hospital.  Is using manual pump at home.  Gave mother 2nd manual pump so she can pump both at the same time. [facility] LC will send WIC pump referral. During consult baby took 2.5 ounces with Dr Irving BurtonBrowns slow flow bottle.  Sleepy afterwards.  Mother burping. Mother will post pump shortly to empty her full breasts.    Patient Name: Emily Best WUJWJ'XToday's Date: 05/29/2016 Reason for consult: Hyperbilirubinemia   Maternal Data    Feeding Feeding Type: Breast Fed Nipple Type: Regular Length of feed: 5 min  LATCH Score/Interventions Latch: Grasps breast easily, tongue down, lips flanged, rhythmical sucking. Intervention(s): Breast massage  Audible Swallowing: A few with stimulation Intervention(s): Skin to skin;Hand expression;Alternate breast massage  Type of Nipple: Everted at rest and after stimulation  Comfort (Breast/Nipple): Soft / non-tender     Hold (Positioning): Assistance needed to correctly position infant at breast and maintain latch.  LATCH Score: 8  Lactation Tools Discussed/Used     Consult Status Consult  Status: Complete    Hardie PulleyBerkelhammer, Zhara Gieske Boschen 05/29/2016, 1:20 PM

## 2016-07-01 ENCOUNTER — Encounter: Payer: Self-pay | Admitting: Medical

## 2016-07-01 ENCOUNTER — Ambulatory Visit (INDEPENDENT_AMBULATORY_CARE_PROVIDER_SITE_OTHER): Payer: Self-pay | Admitting: Medical

## 2016-07-01 LAB — POCT PREGNANCY, URINE: Preg Test, Ur: NEGATIVE

## 2016-07-01 NOTE — Progress Notes (Signed)
117/79 Subjective:     Emily Best is a 26 y.o. female who presents for a postpartum visit. She is 6 weeks postpartum following a spontaneous vaginal delivery. I have fully reviewed the prenatal and intrapartum course. The delivery was at 40 gestational weeks. Outcome: vaginal. Anesthesia: none. Postpartum course has been uncomplicated. Baby's course has been uncomplicated. Baby is feeding by bottle, Enfamil newborn. Patient had been breastfeeding, but discontinued within the last few weeks. She states minimal milk production remains. She denies any signs or symptoms or engorgement or mastitis. Bleeding none at this time. Bowel function is normal. Bladder function is normal. Patient is sexually active. Patient states one encounter of unprotected intercourse on 06/25/16. Contraception method is none at this time but desire depo provera. Postpartum depression screening: negative.  The following portions of the patient's history were reviewed and updated as appropriate: allergies, current medications, past family history, past medical history, past social history, past surgical history and problem list.  Review of Systems Pertinent items are noted in HPI.   Objective:    BP 117/79   Pulse 80   Wt 139 lb 8 oz (63.3 kg)   BMI 20.02 kg/m   General:  alert and cooperative   Breasts:  not performed  Lungs: normal effort  Heart:  normal rate  Abdomen: soft, non-tender   Vulva:  not evaluated  Vagina: not evaluated  Cervix:  not evaluated  Corpus: not examined  Adnexa:  not evaluated  Rectal Exam: Not performed.        Assessment:     Normal postpartum exam. Pap smear not done at today's visit.   Plan:    1. Contraception: abstinence or condoms until Depo Provera is initiated 2. Follow up in: 07/09/16 for Depo Provera (as long as UPT is negative at that time) or sooner as needed.    Marny LowensteinJulie N Wenzel, PA-C 07/01/2016 10:04 AM

## 2016-07-01 NOTE — Patient Instructions (Signed)
Hormonal Contraception Information Introduction Estrogen and progesterone (progestin) are hormones used in many forms of birth control (contraception). These two hormones make up most hormonal contraceptives. Hormonal contraceptives use either:  A combination of estrogen hormone and progesterone hormone in one of these forms:  Pill. Pills come in various combinations of active hormone pills and nonhormonal pills. Different combinations of pills may give you a period once a month, once every 3 months, or no period at all. It is important to take the pills the same time each day.  Patch. The patch is placed on the lower abdomen every week for 3 weeks. On the fourth week, the patch is not placed.  Vaginal ring. The ring is placed in the vagina and left there for 3 weeks. It is then removed for 1 week.  Progesterone alone in one of these forms:  Pill. Hormone pills are taken every day of the cycle.  Intrauterine device (IUD). The IUD is inserted during a menstrual period and removed or replaced every 5 years or sooner.  Implant. Plastic rods are placed under the skin of the upper arm. They are removed or replaced every 3 years or sooner.  Injection. The injection is given once every 90 days. Pregnancy can still occur with any of these hormonal contraceptive methods. If you have any suspicion that you might be pregnant, take a pregnancy test and talk to your health care provider. Estrogen and progesterone contraceptives Estrogen and progesterone contraceptives can prevent pregnancy by:  Stopping the release of an egg (ovulation).  Thickening the mucus of the cervix, making it difficult for sperm to enter the uterus.  Changing the lining of the uterus. This change makes it more difficult for an egg to implant. Progesterone contraceptives Progesterone-only contraceptives can prevent pregnancy by:  Blocking ovulation. This occurs in many women, but some women will continue to  ovulate.  Preventing the entry of sperm into the uterus by keeping the cervical mucus thick and sticky.  Changing the lining of the uterus. This change makes it more difficult for an egg to implant. Side effects Talk to your health care provider about what side effects may affect you. If you develop persistent side effects or if the effects are severe, talk to your health care provider.  Estrogen. Side effects from estrogen occur more often in the first 2-3 months. They include:  Progesterone. Side effects of progesterone can vary. They include: Questions to ask This information is not intended to replace advice given to you by your health care provider. Make sure you discuss any questions you have with your health care provider. Document Released: 05/30/2007 Document Revised: 02/11/2016 Document Reviewed: 10/22/2012  2017 Elsevier  

## 2016-07-09 ENCOUNTER — Ambulatory Visit: Payer: Self-pay

## 2017-04-04 ENCOUNTER — Ambulatory Visit: Payer: Self-pay

## 2017-04-04 ENCOUNTER — Ambulatory Visit (INDEPENDENT_AMBULATORY_CARE_PROVIDER_SITE_OTHER): Payer: Medicaid Other | Admitting: General Practice

## 2017-04-04 DIAGNOSIS — Z3201 Encounter for pregnancy test, result positive: Secondary | ICD-10-CM | POA: Diagnosis present

## 2017-04-04 DIAGNOSIS — O3680X0 Pregnancy with inconclusive fetal viability, not applicable or unspecified: Secondary | ICD-10-CM

## 2017-04-04 DIAGNOSIS — Z3490 Encounter for supervision of normal pregnancy, unspecified, unspecified trimester: Secondary | ICD-10-CM

## 2017-04-04 DIAGNOSIS — O0930 Supervision of pregnancy with insufficient antenatal care, unspecified trimester: Secondary | ICD-10-CM

## 2017-04-04 DIAGNOSIS — Z3689 Encounter for other specified antenatal screening: Secondary | ICD-10-CM

## 2017-04-04 LAB — POCT PREGNANCY, URINE: Preg Test, Ur: POSITIVE — AB

## 2017-04-04 NOTE — Progress Notes (Signed)
Patient here for upt today, upt +. Patient reports first positive home test 2nd week in September. Patient reports irregular periods. LMP 10/08/16. Patient reports taking a multivitamin but no medications. Told patient we will do ultrasound today to confirm dating.  Patient scheduled for anatomy scan following office ultrasound & recommended she begin prenatal care. Patient verbalized understanding.

## 2017-04-04 NOTE — Progress Notes (Addendum)
Pt informed that the ultrasound is considered a limited OB ultrasound and is not intended to be a complete ultrasound exam.  Patient also informed that the ultrasound is not being completed with the intent of assessing for fetal or placental anomalies or any pelvic abnormalities.  Explained that the purpose of today's ultrasound is to assess for viability, approximate EGA .  Patient acknowledges the purpose of the exam and the limitations of the study.    Single IUP FHR - 146 per PW doppler FM present FL- 3.40 cm (20w 5d)

## 2017-04-06 ENCOUNTER — Other Ambulatory Visit: Payer: Self-pay | Admitting: Family Medicine

## 2017-04-06 ENCOUNTER — Ambulatory Visit (HOSPITAL_COMMUNITY)
Admission: RE | Admit: 2017-04-06 | Discharge: 2017-04-06 | Disposition: A | Discharge status: Home / Self Care | Payer: Medicaid Other | Source: Ambulatory Visit | Attending: Family Medicine | Admitting: Family Medicine

## 2017-04-06 DIAGNOSIS — Z363 Encounter for antenatal screening for malformations: Secondary | ICD-10-CM | POA: Diagnosis not present

## 2017-04-06 DIAGNOSIS — Z3A20 20 weeks gestation of pregnancy: Secondary | ICD-10-CM

## 2017-04-06 DIAGNOSIS — N1330 Unspecified hydronephrosis: Secondary | ICD-10-CM | POA: Insufficient documentation

## 2017-04-06 DIAGNOSIS — Z3490 Encounter for supervision of normal pregnancy, unspecified, unspecified trimester: Secondary | ICD-10-CM

## 2017-04-06 DIAGNOSIS — Z3689 Encounter for other specified antenatal screening: Secondary | ICD-10-CM

## 2017-04-06 DIAGNOSIS — O0930 Supervision of pregnancy with insufficient antenatal care, unspecified trimester: Secondary | ICD-10-CM

## 2017-04-19 ENCOUNTER — Encounter: Payer: Self-pay | Admitting: Medical

## 2017-04-19 ENCOUNTER — Other Ambulatory Visit (HOSPITAL_COMMUNITY)
Admission: RE | Admit: 2017-04-19 | Discharge: 2017-04-19 | Disposition: A | Discharge status: Home / Self Care | Payer: Medicaid Other | Source: Ambulatory Visit | Attending: Medical | Admitting: Medical

## 2017-04-19 ENCOUNTER — Ambulatory Visit (INDEPENDENT_AMBULATORY_CARE_PROVIDER_SITE_OTHER): Payer: Medicaid Other | Admitting: Medical

## 2017-04-19 DIAGNOSIS — Z3480 Encounter for supervision of other normal pregnancy, unspecified trimester: Secondary | ICD-10-CM | POA: Insufficient documentation

## 2017-04-19 DIAGNOSIS — Z3482 Encounter for supervision of other normal pregnancy, second trimester: Secondary | ICD-10-CM | POA: Insufficient documentation

## 2017-04-19 DIAGNOSIS — Z3A22 22 weeks gestation of pregnancy: Secondary | ICD-10-CM | POA: Insufficient documentation

## 2017-04-19 DIAGNOSIS — O0932 Supervision of pregnancy with insufficient antenatal care, second trimester: Secondary | ICD-10-CM

## 2017-04-19 LAB — POCT URINALYSIS DIP (DEVICE)
Bilirubin Urine: NEGATIVE
Glucose, UA: NEGATIVE mg/dL
HGB URINE DIPSTICK: NEGATIVE
Ketones, ur: NEGATIVE mg/dL
Leukocytes, UA: NEGATIVE
NITRITE: NEGATIVE
PH: 5.5 (ref 5.0–8.0)
PROTEIN: NEGATIVE mg/dL
UROBILINOGEN UA: 0.2 mg/dL (ref 0.0–1.0)

## 2017-04-19 MED ORDER — PREPLUS 27-1 MG PO TABS: 1.0000 | ORAL_TABLET | Freq: Every day | ORAL | 11 refills | Status: AC

## 2017-04-19 NOTE — Progress Notes (Signed)
   PRENATAL VISIT NOTE  Subjective:  Emily Best is a 26 y.o. [phone] at 298w2d being seen today for her initial prenatal care visit.  She is currently monitored for the following issues for this low-risk pregnancy and has Supervision of other normal pregnancy, antepartum on their problem list.  Patient reports no complaints.  Contractions: Not present. Vag. Bleeding: None.  Movement: Present. Denies leaking of fluid.   The following portions of the patient's history were reviewed and updated as appropriate: allergies, current medications, past family history, past medical history, past social history, past surgical history and problem list. Problem list updated.  Objective:   Vitals:   04/19/17 0800  BP: 108/61  Pulse: 91  Weight: 145 lb 4.8 oz (65.9 kg)    Fetal Status: Fetal Heart Rate (bpm): 145   Movement: Present     General:  Alert, oriented and cooperative. Patient is in no acute distress.  Skin: Skin is warm and dry. No rash noted.   Cardiovascular: Normal heart rate noted  Respiratory: Normal respiratory effort, no problems with respiration noted  Abdomen: Soft, gravid, appropriate for gestational age.  Pain/Pressure: Present     Pelvic: Cervical exam performed       closed/thick Normal discharge. Scant bleeding after pap smear. Normal cervical contour.  Breast: symmetric. No masses. No nipple discharge. No tenderness to palpation. No skin changes.   Extremities: Normal range of motion.  Edema: None  Mental Status:  Normal mood and affect. Normal behavior. Normal judgment and thought content.   Assessment and Plan:  Pregnancy: [id number] at [redacted]w[redacted]d  1. Supervision of other normal pregnancy, antepartum - Obstetric Panel, Including HIV - Culture, OB Urine - Cytology - PAP - Enroll Patient in Babyscripts - Prenatal Vit-Fe Fumarate-FA (PREPLUS) 27-1 MG TABS; Take 1 tablet by mouth daily.  Dispense: 30 tablet; Refill: 11 - Needs follow-up US scheduled at next visit for >  28 weeks to reassess UTD - Panorama at next visit - we were out of stock of tests today  - Plans for BTL, will sign consent at 28 weeks  2. Late prenatal care, second trimester - Started care at 22 weeks   Preterm labor symptoms and general obstetric precautions including but not limited to vaginal bleeding, contractions, leaking of fluid and fetal movement were reviewed in detail with the patient. Please refer to After Visit Summary for other counseling recommendations.  Return in about 4 weeks (around 05/17/2017) for LOB.   Vonzella NippleJulie Nanda Bittick, PA-C

## 2017-04-19 NOTE — Progress Notes (Addendum)
  Here for initial prenatal visit. Dec lines flu shot. Would like genetic testing- discussed panorama. Enrolled in babyscripts app. Given new patient education booklets. PMH form completed.

## 2017-04-19 NOTE — Patient Instructions (Signed)
Second Trimester of Pregnancy The second trimester is from week 13 through week 28, month 4 through 6. This is often the time in pregnancy that you feel your best. Often times, morning sickness has lessened or quit. You may have more energy, and you may get hungry more often. Your unborn baby (fetus) is growing rapidly. At the end of the sixth month, he or she is about 9 inches long and weighs about 1 pounds. You will likely feel the baby move (quickening) between 18 and 20 weeks of pregnancy. Follow these instructions at home:  Avoid all smoking, herbs, and alcohol. Avoid drugs not approved by your doctor.  Do not use any tobacco products, including cigarettes, chewing tobacco, and electronic cigarettes. If you need help quitting, ask your doctor. You may get counseling or other support to help you quit.  Only take medicine as told by your doctor. Some medicines are safe and some are not during pregnancy.  Exercise only as told by your doctor. Stop exercising if you start having cramps.  Eat regular, healthy meals.  Wear a good support bra if your breasts are tender.  Do not use hot tubs, steam rooms, or saunas.  Wear your seat belt when driving.  Avoid raw meat, uncooked cheese, and liter boxes and soil used by cats.  Take your prenatal vitamins.  Take 1500-2000 milligrams of calcium daily starting at the 20th week of pregnancy until you deliver your baby.  Try taking medicine that helps you poop (stool softener) as needed, and if your doctor approves. Eat more fiber by eating fresh fruit, vegetables, and whole grains. Drink enough fluids to keep your pee (urine) clear or pale yellow.  Take warm water baths (sitz baths) to soothe pain or discomfort caused by hemorrhoids. Use hemorrhoid cream if your doctor approves.  If you have puffy, bulging veins (varicose veins), wear support hose. Raise (elevate) your feet for 15 minutes, 3-4 times a day. Limit salt in your diet.  Avoid heavy  lifting, wear low heals, and sit up straight.  Rest with your legs raised if you have leg cramps or low back pain.  Visit your dentist if you have not gone during your pregnancy. Use a soft toothbrush to brush your teeth. Be gentle when you floss.  You can have sex (intercourse) unless your doctor tells you not to.  Go to your doctor visits. Get help if:  You feel dizzy.  You have mild cramps or pressure in your lower belly (abdomen).  You have a nagging pain in your belly area.  You continue to feel sick to your stomach (nauseous), throw up (vomit), or have watery poop (diarrhea).  You have bad smelling fluid coming from your vagina.  You have pain with peeing (urination). Get help right away if:  You have a fever.  You are leaking fluid from your vagina.  You have spotting or bleeding from your vagina.  You have severe belly cramping or pain.  You lose or gain weight rapidly.  You have trouble catching your breath and have chest pain.  You notice sudden or extreme puffiness (swelling) of your face, hands, ankles, feet, or legs.  You have not felt the baby move in over an hour.  You have severe headaches that do not go away with medicine.  You have vision changes. This information is not intended to replace advice given to you by your health care provider. Make sure you discuss any questions you have with your health care   provider. Document Released: 08/04/2009 Document Revised: 10/16/2015 Document Reviewed: 07/11/2012 Elsevier Interactive Patient Education  2017 Elsevier Inc.  

## 2017-04-20 LAB — CYTOLOGY - PAP
CHLAMYDIA, DNA PROBE: NEGATIVE
DIAGNOSIS: NEGATIVE
NEISSERIA GONORRHEA: NEGATIVE

## 2017-04-20 LAB — OBSTETRIC PANEL, INCLUDING HIV
Antibody Screen: NEGATIVE
BASOS ABS: 0 10*3/uL (ref 0.0–0.2)
Basos: 0 %
EOS (ABSOLUTE): 0.1 10*3/uL (ref 0.0–0.4)
Eos: 2 %
HEMOGLOBIN: 10.2 g/dL — AB (ref 11.1–15.9)
HEP B S AG: NEGATIVE
HIV Screen 4th Generation wRfx: NONREACTIVE
Hematocrit: 31.8 % — ABNORMAL LOW (ref 34.0–46.6)
IMMATURE GRANS (ABS): 0 10*3/uL (ref 0.0–0.1)
IMMATURE GRANULOCYTES: 0 %
LYMPHS ABS: 1.9 10*3/uL (ref 0.7–3.1)
LYMPHS: 30 %
MCH: 27.7 pg (ref 26.6–33.0)
MCHC: 32.1 g/dL (ref 31.5–35.7)
MCV: 86 fL (ref 79–97)
MONOS ABS: 0.4 10*3/uL (ref 0.1–0.9)
Monocytes: 7 %
NEUTROS PCT: 61 %
Neutrophils Absolute: 3.8 10*3/uL (ref 1.4–7.0)
PLATELETS: 183 10*3/uL (ref 150–379)
RBC: 3.68 x10E6/uL — AB (ref 3.77–5.28)
RDW: 13.7 % (ref 12.3–15.4)
RH TYPE: POSITIVE
RPR: NONREACTIVE
Rubella Antibodies, IGG: 4.56 index (ref >0.99)
WBC: 6.3 10*3/uL (ref 3.4–10.8)

## 2017-04-21 LAB — CULTURE, OB URINE

## 2017-04-21 LAB — URINE CULTURE, OB REFLEX: ORGANISM ID, BACTERIA: NO GROWTH

## 2017-04-26 ENCOUNTER — Encounter: Payer: Self-pay | Admitting: Obstetrics & Gynecology

## 2017-05-12 ENCOUNTER — Encounter: Payer: Medicaid Other | Admitting: Student

## 2017-05-24 NOTE — L&D Delivery Note (Signed)
Delivery Note At 3:30 PM a viable female was delivered via Vaginal, Spontaneous (Presentation: cephalid; ROA  ).  APGAR: 8, 9 Placenta status: spontaneous, intact .  Cord: 3 vessel with the following complications: none.   Anesthesia: fenanyl Episiotomy: None Lacerations: None Suture Repair: na Est. Blood Loss (mL): 100  Mom to postpartum.  Baby to Couplet care / Skin to Skin.  Infant placed in mother's arms immediately following delivery.  Cord clamping delayed by 1 minute, then clamped by resident, and family members given opportunity to cut cord. They declined and mother cut cord. Infant skin to skin for at least 1 hour and breastfeeding initiated if desired prior to routine exam/weights/measurements.   Myrene BuddyJacob Takya Vandivier 08/23/2017, 4:24 PM

## 2017-05-31 ENCOUNTER — Ambulatory Visit (INDEPENDENT_AMBULATORY_CARE_PROVIDER_SITE_OTHER): Payer: Medicaid Other | Admitting: Obstetrics and Gynecology

## 2017-05-31 DIAGNOSIS — Z23 Encounter for immunization: Secondary | ICD-10-CM | POA: Diagnosis not present

## 2017-05-31 DIAGNOSIS — O9989 Other specified diseases and conditions complicating pregnancy, childbirth and the puerperium: Secondary | ICD-10-CM

## 2017-05-31 DIAGNOSIS — M7918 Myalgia, other site: Secondary | ICD-10-CM

## 2017-05-31 DIAGNOSIS — N949 Unspecified condition associated with female genital organs and menstrual cycle: Secondary | ICD-10-CM

## 2017-05-31 DIAGNOSIS — Z3483 Encounter for supervision of other normal pregnancy, third trimester: Secondary | ICD-10-CM

## 2017-05-31 DIAGNOSIS — Z3480 Encounter for supervision of other normal pregnancy, unspecified trimester: Secondary | ICD-10-CM

## 2017-05-31 DIAGNOSIS — O99891 Other specified diseases and conditions complicating pregnancy: Secondary | ICD-10-CM | POA: Insufficient documentation

## 2017-05-31 NOTE — Progress Notes (Signed)
   PRENATAL VISIT NOTE  Subjective:  Emily Best is a 27 y.o. [phone] at 5068w2d being seen today for ongoing prenatal care.  She is currently monitored for the following issues for this low-risk pregnancy and has Supervision of other normal pregnancy, antepartum; Late prenatal care affecting pregnancy in second trimester; Pain in symphysis pubis during pregnancy; and Round ligament pain on their problem list.  Patient reports no complaints.  Contractions: Irritability. Vag. Bleeding: None.  Movement: Present. Denies leaking of fluid.   The following portions of the patient's history were reviewed and updated as appropriate: allergies, current medications, past family history, past medical history, past social history, past surgical history and problem list. Problem list updated.  Objective:   Vitals:   05/31/17 1128  BP: (!) 97/54  Pulse: 70  Weight: 152 lb 3.2 oz (69 kg)    Fetal Status: Fetal Heart Rate (bpm): 146 Fundal Height: 27 cm Movement: Present     General:  Alert, oriented and cooperative. Patient is in no acute distress.  Skin: Skin is warm and dry. No rash noted.   Cardiovascular: Normal heart rate noted  Respiratory: Normal respiratory effort, no problems with respiration noted  Abdomen: Soft, gravid, appropriate for gestational age.  Pain/Pressure: Present     Pelvic: Cervical exam deferred        Extremities: Normal range of motion.  Edema: None  Mental Status:  Normal mood and affect. Normal behavior. Normal judgment and thought content.   Assessment and Plan:  Pregnancy: [id number] at 8168w2d   1. Supervision of other normal pregnancy, antepartum  - Tdap vaccine greater than or equal to 7yo IM - Glucose Tolerance, 2 Hours w/1 Hour - RPR - CBC - HIV antibody - Genetic Screening  2. Pain in symphysis pubis during pregnancy   3. Round ligament pain  Pregnancy support belt recommended.   There are no diagnoses linked to this encounter. Preterm labor  symptoms and general obstetric precautions including but not limited to vaginal bleeding, contractions, leaking of fluid and fetal movement were reviewed in detail with the patient. Please refer to After Visit Summary for other counseling recommendations.  Return in about 2 weeks (around 06/14/2017).   Venia CarbonJennifer Rickayla Wieland, NP

## 2017-05-31 NOTE — Progress Notes (Signed)
Pt stated bone area left side of the vagina painful when walking 2 months

## 2017-05-31 NOTE — Patient Instructions (Signed)

## 2017-06-01 LAB — CBC
HEMOGLOBIN: 11.3 g/dL (ref 11.1–15.9)
Hematocrit: 34.7 % (ref 34.0–46.6)
MCH: 29 pg (ref 26.6–33.0)
MCHC: 32.6 g/dL (ref 31.5–35.7)
MCV: 89 fL (ref 79–97)
PLATELETS: 179 10*3/uL (ref 150–379)
RBC: 3.9 x10E6/uL (ref 3.77–5.28)
RDW: 14.9 % (ref 12.3–15.4)
WBC: 6 10*3/uL (ref 3.4–10.8)

## 2017-06-01 LAB — RPR: RPR: NONREACTIVE

## 2017-06-01 LAB — GLUCOSE TOLERANCE, 2 HOURS W/ 1HR
GLUCOSE, 1 HOUR: 88 mg/dL (ref 65–179)
GLUCOSE, 2 HOUR: 84 mg/dL (ref 65–152)
Glucose, Fasting: 70 mg/dL (ref 65–91)

## 2017-06-01 LAB — HIV ANTIBODY (ROUTINE TESTING W REFLEX): HIV SCREEN 4TH GENERATION: NONREACTIVE

## 2017-06-07 ENCOUNTER — Encounter: Payer: Self-pay | Admitting: *Deleted

## 2017-06-14 ENCOUNTER — Ambulatory Visit (INDEPENDENT_AMBULATORY_CARE_PROVIDER_SITE_OTHER): Payer: Medicaid Other | Admitting: Student

## 2017-06-14 VITALS — BP 100/58 | HR 77 | Wt 156.4 lb

## 2017-06-14 DIAGNOSIS — Z3009 Encounter for other general counseling and advice on contraception: Secondary | ICD-10-CM | POA: Insufficient documentation

## 2017-06-14 DIAGNOSIS — Z3480 Encounter for supervision of other normal pregnancy, unspecified trimester: Secondary | ICD-10-CM

## 2017-06-14 DIAGNOSIS — O09293 Supervision of pregnancy with other poor reproductive or obstetric history, third trimester: Secondary | ICD-10-CM

## 2017-06-14 NOTE — Patient Instructions (Signed)

## 2017-06-14 NOTE — Progress Notes (Signed)
   PRENATAL VISIT NOTE  Subjective:  Emily Best is a 27 y.o. [phone] at 1223w2d being seen today for ongoing prenatal care.  She is currently monitored for the following issues for this low-risk pregnancy and has Supervision of other normal pregnancy, antepartum; Late prenatal care affecting pregnancy in second trimester; Pain in symphysis pubis during pregnancy; Round ligament pain; and Hx of macrosomia in infant in prior pregnancy, currently pregnant, third trimester on their problem list.  Patient reports no complaints.  Contractions: Not present. Vag. Bleeding: None.  Movement: Present. Denies leaking of fluid.   The following portions of the patient's history were reviewed and updated as appropriate: allergies, current medications, past family history, past medical history, past social history, past surgical history and problem list. Problem list updated.  Objective:   Vitals:   06/14/17 0822  BP: (!) 100/58  Pulse: 77  Weight: 156 lb 6.4 oz (70.9 kg)    Fetal Status: Fetal Heart Rate (bpm): 138 Fundal Height: 31 cm Movement: Present     General:  Alert, oriented and cooperative. Patient is in no acute distress.  Skin: Skin is warm and dry. No rash noted.   Cardiovascular: Normal heart rate noted  Respiratory: Normal respiratory effort, no problems with respiration noted  Abdomen: Soft, gravid, appropriate for gestational age.  Pain/Pressure: Present     Pelvic: Cervical exam deferred        Extremities: Normal range of motion.  Edema: None  Mental Status:  Normal mood and affect. Normal behavior. Normal judgment and thought content.   Assessment and Plan:  Pregnancy: [id number] at 7323w2d  1. Supervision of other normal pregnancy, antepartum -Ultrasound ordered to reassess fetal pyelectasis - US MFM OB FOLLOW UP; Future -Signed tubal papers today  2. Hx of macrosomia in infant in prior pregnancy, currently pregnant, third trimester -FH appropriate for gestation  Preterm  labor symptoms and general obstetric precautions including but not limited to vaginal bleeding, contractions, leaking of fluid and fetal movement were reviewed in detail with the patient. Please refer to After Visit Summary for other counseling recommendations.  Return in about 2 weeks (around 06/28/2017) for Routine OB.   Judeth HornErin Devanie Galanti, NP

## 2017-06-20 ENCOUNTER — Ambulatory Visit (HOSPITAL_COMMUNITY)
Admission: RE | Admit: 2017-06-20 | Discharge: 2017-06-20 | Disposition: A | Discharge status: Home / Self Care | Payer: Medicaid Other | Source: Ambulatory Visit | Attending: Student | Admitting: Student

## 2017-06-20 ENCOUNTER — Other Ambulatory Visit: Payer: Self-pay | Admitting: Student

## 2017-06-20 DIAGNOSIS — O358XX0 Maternal care for other (suspected) fetal abnormality and damage, not applicable or unspecified: Secondary | ICD-10-CM | POA: Diagnosis present

## 2017-06-20 DIAGNOSIS — Z362 Encounter for other antenatal screening follow-up: Secondary | ICD-10-CM | POA: Diagnosis not present

## 2017-06-20 DIAGNOSIS — Z3A31 31 weeks gestation of pregnancy: Secondary | ICD-10-CM | POA: Insufficient documentation

## 2017-06-20 DIAGNOSIS — Z3480 Encounter for supervision of other normal pregnancy, unspecified trimester: Secondary | ICD-10-CM

## 2017-06-20 DIAGNOSIS — O359XX0 Maternal care for (suspected) fetal abnormality and damage, unspecified, not applicable or unspecified: Secondary | ICD-10-CM

## 2017-06-28 ENCOUNTER — Encounter: Payer: Self-pay | Admitting: *Deleted

## 2017-06-28 ENCOUNTER — Ambulatory Visit (INDEPENDENT_AMBULATORY_CARE_PROVIDER_SITE_OTHER): Payer: Medicaid Other | Admitting: Student

## 2017-06-28 DIAGNOSIS — Z3480 Encounter for supervision of other normal pregnancy, unspecified trimester: Secondary | ICD-10-CM

## 2017-06-28 NOTE — Patient Instructions (Signed)
Laparoscopic Tubal Ligation Laparoscopic tubal ligation is a procedure to close the fallopian tubes. This is done so that you cannot get pregnant. When the fallopian tubes are closed, the eggs that your ovaries release cannot enter the uterus, and sperm cannot reach the released eggs. A laparoscopic tubal ligation is sometimes called "getting your tubes tied." You should not have this procedure if you want to get pregnant someday or if you are unsure about having more children. Tell a health care provider about:  Any allergies you have.  All medicines you are taking, including vitamins, herbs, eye drops, creams, and over-the-counter medicines.  Any problems you or family members have had with anesthetic medicines.  Any blood disorders you have.  Any surgeries you have had.  Any medical conditions you have.  Whether you are pregnant or may be pregnant.  Any past pregnancies. What are the risks? Generally, this is a safe procedure. However, problems may occur, including:  Infection.  Bleeding.  Injury to surrounding organs.  Side effects from anesthetics.  Failure of the procedure.  This procedure can increase your risk of a kind of pregnancy in which a fertilized egg attaches to the outside of the uterus (ectopic pregnancy). What happens before the procedure?  Ask your health care provider about: ? Changing or stopping your regular medicines. This is especially important if you are taking diabetes medicines or blood thinners. ? Taking medicines such as aspirin and ibuprofen. These medicines can thin your blood. Do not take these medicines before your procedure if your health care provider instructs you not to.  Follow instructions from your health care provider about eating and drinking restrictions.  Plan to have someone take you home after the procedure.  If you go home right after the procedure, plan to have someone with you for 24 hours. What happens during the  procedure?  You will be given one or more of the following: ? A medicine to help you relax (sedative). ? A medicine to numb the area (local anesthetic). ? A medicine to make you fall asleep (general anesthetic). ? A medicine that is injected into an area of your body to numb everything below the injection site (regional anesthetic).  An IV tube will be inserted into one of your veins. It will be used to give you medicines and fluids during the procedure.  Your bladder may be emptied with a small tube (catheter).  If you have been given a general anesthetic, a tube will be put down your throat to help you breathe.  Two small cuts (incisions) will be made in your lower abdomen and near your belly button.  Your abdomen will be inflated with a gas. This will let the surgeon see better and will give the surgeon room to work.  A thin, lighted tube (laparoscope) with a camera attached will be inserted into your abdomen through one of the incisions. Small instruments will be inserted through the other incision.  The fallopian tubes will be tied off, burned (cauterized), or blocked with a clip, ring, or clamp. A small portion in the center of each fallopian tube may be removed.  The gas will be released from the abdomen.  The incisions will be closed with stitches (sutures).  A bandage (dressing) will be placed over the incisions. The procedure may vary among health care providers and hospitals. What happens after the procedure?  Your blood pressure, heart rate, breathing rate, and blood oxygen level will be monitored often until the medicines you   were given have worn off.  You will be given medicine to help with pain, nausea, and vomiting as needed. This information is not intended to replace advice given to you by your health care provider. Make sure you discuss any questions you have with your health care provider. Document Released: 08/16/2000 Document Revised: 10/16/2015 Document  Reviewed: 04/20/2015 Elsevier Interactive Patient Education  2018 Elsevier Inc.  

## 2017-06-28 NOTE — Progress Notes (Signed)
   PRENATAL VISIT NOTE  Subjective:  Emily Best is a 27 y.o. [phone] at 2960w2d being seen today for ongoing prenatal care.  She is currently monitored for the following issues for this low-risk pregnancy and has Supervision of other normal pregnancy, antepartum; Late prenatal care affecting pregnancy in second trimester; Pain in symphysis pubis during pregnancy; Round ligament pain; Hx of macrosomia in infant in prior pregnancy, currently pregnant, third trimester; and Unwanted fertility on their problem list.  Patient reports no complaints.  Contractions: Not present. Vag. Bleeding: None.  Movement: Present. Denies leaking of fluid.   The following portions of the patient's history were reviewed and updated as appropriate: allergies, current medications, past family history, past medical history, past social history, past surgical history and problem list. Problem list updated.  Objective:   Vitals:   06/28/17 0816  BP: (!) 99/58  Pulse: 73  Weight: 157 lb (71.2 kg)    Fetal Status: Fetal Heart Rate (bpm): 136 Fundal Height: 34 cm Movement: Present     General:  Alert, oriented and cooperative. Patient is in no acute distress.  Skin: Skin is warm and dry. No rash noted.   Cardiovascular: Normal heart rate noted  Respiratory: Normal respiratory effort, no problems with respiration noted  Abdomen: Soft, gravid, appropriate for gestational age.  Pain/Pressure: Absent     Pelvic: Cervical exam deferred        Extremities: Normal range of motion.  Edema: None  Mental Status:  Normal mood and affect. Normal behavior. Normal judgment and thought content.   Assessment and Plan:  Pregnancy: [id number] at 2560w2d  1. Supervision of other normal pregnancy, antepartum -Discussed results of recent ultrasound & NIPs testing. Pt doing well with no complaints today.   Preterm labor symptoms and general obstetric precautions including but not limited to vaginal bleeding, contractions, leaking of  fluid and fetal movement were reviewed in detail with the patient. Please refer to After Visit Summary for other counseling recommendations.  Return in about 2 weeks (around 07/12/2017) for Routine OB.   Judeth HornErin Margarine Grosshans, NP

## 2017-07-13 ENCOUNTER — Ambulatory Visit (INDEPENDENT_AMBULATORY_CARE_PROVIDER_SITE_OTHER): Payer: Medicaid Other | Admitting: Student

## 2017-07-13 ENCOUNTER — Encounter: Payer: Self-pay | Admitting: *Deleted

## 2017-07-13 VITALS — BP 103/61 | HR 85 | Wt 158.1 lb

## 2017-07-13 DIAGNOSIS — Z3483 Encounter for supervision of other normal pregnancy, third trimester: Secondary | ICD-10-CM

## 2017-07-13 DIAGNOSIS — Z3480 Encounter for supervision of other normal pregnancy, unspecified trimester: Secondary | ICD-10-CM

## 2017-07-13 DIAGNOSIS — Z3009 Encounter for other general counseling and advice on contraception: Secondary | ICD-10-CM

## 2017-07-13 NOTE — Progress Notes (Signed)
   PRENATAL VISIT NOTE  Subjective:  Emily Best is a 27 y.o. [id number] at 2849w3d being seen today for ongoing prenatal care.  She is currently monitored for the following issues for this low-risk pregnancy and has Supervision of other normal pregnancy, antepartum; Late prenatal care affecting pregnancy in second trimester; Pain in symphysis pubis during pregnancy; Round ligament pain; Hx of macrosomia in infant in prior pregnancy, currently pregnant, third trimester; and Unwanted fertility on their problem list.  Patient reports no complaints.  Contractions: Not present. Vag. Bleeding: None.  Movement: Present. Denies leaking of fluid.   The following portions of the patient's history were reviewed and updated as appropriate: allergies, current medications, past family history, past medical history, past social history, past surgical history and problem list. Problem list updated.  Objective:   Vitals:   07/13/17 0830  BP: 103/61  Pulse: 85  Weight: 158 lb 1.6 oz (71.7 kg)    Fetal Status: Fetal Heart Rate (bpm): 138 Fundal Height: 35 cm Movement: Present     General:  Alert, oriented and cooperative. Patient is in no acute distress.  Skin: Skin is warm and dry. No rash noted.   Cardiovascular: Normal heart rate noted  Respiratory: Normal respiratory effort, no problems with respiration noted  Abdomen: Soft, gravid, appropriate for gestational age.  Pain/Pressure: Absent     Pelvic: Cervical exam deferred        Extremities: Normal range of motion.  Edema: None  Mental Status:  Normal mood and affect. Normal behavior. Normal judgment and thought content.   Assessment and Plan:  Pregnancy: [id number] at 7649w3d  1. Supervision of other normal pregnancy, antepartum - doing well -discussed GBS collection next visit  2. Unwanted fertility -BTL consent corrected and signed again today  Preterm labor symptoms and general obstetric precautions including but not limited to vaginal  bleeding, contractions, leaking of fluid and fetal movement were reviewed in detail with the patient. Please refer to After Visit Summary for other counseling recommendations.  Return in about 2 weeks (around 07/27/2017) for Routine OB.   Judeth HornErin Heather Streeper, NP

## 2017-07-13 NOTE — Patient Instructions (Signed)
Before Baby Comes Home  Before your baby arrives it is important to:   Have all of the supplies that you will need to care for your baby.   Know where to go if there is an emergency.   Discuss the baby's arrival with other family members.    What supplies will I need?    It is recommended that you have the following supplies:  Large Items   Crib.   Crib mattress.   Rear-facing infant car seat. If possible, have a trained professional check to make sure that it is installed correctly.    Feeding   6-8 bottles that are 4-5 oz in size.   6-8 nipples.   Bottle brush.   Sterilizer, or a large pan or kettle with a lid.   A way to boil and cool water.   If you will be breastfeeding:  ? Breast pump.  ? Nipple cream.  ? Nursing bra.  ? Breast pads.  ? Breast shields.   If you will be formula feeding:  ? Formula.  ? Measuring cups.  ? Measuring spoons.    Bathing   Mild baby soap and baby shampoo.   Petroleum jelly.   Soft cloth towel and washcloth.   Hooded towel.   Cotton balls.   Bath basin.    Other Supplies   Rectal thermometer.   Bulb syringe.   Baby wipes or washcloths for diaper changes.   Diaper bag.   Changing pad.   Clothing, including one-piece outfits and pajamas.   Baby nail clippers.   Receiving blankets.   Mattress pad and sheets for the crib.   Night-light for the baby's room.   Baby monitor.   2 or 3 pacifiers.   Either 24-36 cloth diapers and waterproof diaper covers or a box of disposable diapers. You may need to use as many as 10-12 diapers per day.    How do I prepare for an emergency?  Prepare for an emergency by:   Knowing how to get to the nearest hospital.   Listing the phone numbers of your baby's health care providers near your home phone and in your cell phone.    How do I prepare my family?   Decide how to handle visitors.   If you have other children:  ? Talk with them about the baby coming home. Ask them how they feel about it.  ? Read a book together about  being a new big brother or sister.  ? Find ways to let them help you prepare for the new baby.  ? Have someone ready to care for them while you are in the hospital.  This information is not intended to replace advice given to you by your health care provider. Make sure you discuss any questions you have with your health care provider.  Document Released: 04/22/2008 Document Revised: 10/16/2015 Document Reviewed: 04/17/2014  Elsevier Interactive Patient Education  2018 [employer]vier Inc.

## 2017-07-20 ENCOUNTER — Encounter: Payer: Medicaid Other | Admitting: Student

## 2017-07-26 ENCOUNTER — Other Ambulatory Visit (HOSPITAL_COMMUNITY)
Admission: RE | Admit: 2017-07-26 | Discharge: 2017-07-26 | Disposition: A | Discharge status: Home / Self Care | Payer: Medicaid Other | Source: Ambulatory Visit | Attending: Obstetrics and Gynecology | Admitting: Obstetrics and Gynecology

## 2017-07-26 ENCOUNTER — Ambulatory Visit (INDEPENDENT_AMBULATORY_CARE_PROVIDER_SITE_OTHER): Payer: Medicaid Other | Admitting: Obstetrics and Gynecology

## 2017-07-26 VITALS — BP 103/57 | HR 78 | Wt 162.0 lb

## 2017-07-26 DIAGNOSIS — Z3480 Encounter for supervision of other normal pregnancy, unspecified trimester: Secondary | ICD-10-CM

## 2017-07-26 DIAGNOSIS — Z3483 Encounter for supervision of other normal pregnancy, third trimester: Secondary | ICD-10-CM | POA: Diagnosis not present

## 2017-07-26 LAB — OB RESULTS CONSOLE GBS: GBS: NEGATIVE

## 2017-07-26 LAB — OB RESULTS CONSOLE GC/CHLAMYDIA: Gonorrhea: NEGATIVE

## 2017-07-26 NOTE — Progress Notes (Signed)
   PRENATAL VISIT NOTE  Subjective:  Emily Best is a 27 y.o. [phone] at [redacted]w[redacted]d being seen today for ongoing prenatal care.  She is currently monitored for the following issues for this low-risk pregnancy and has Supervision of other normal pregnancy, antepartum; Late prenatal care affecting pregnancy in second trimester; Pain in symphysis pubis during pregnancy; Round ligament pain; Hx of macrosomia in infant in prior pregnancy, currently pregnant, third trimester; and Unwanted fertility on their problem list.  Patient reports no complaints.  Contractions: Not present. Vag. Bleeding: None.  Movement: Present. Denies leaking of fluid.   The following portions of the patient's history were reviewed and updated as appropriate: allergies, current medications, past family history, past medical history, past social history, past surgical history and problem list. Problem list updated.  Objective:   Vitals:   07/26/17 0935  BP: (!) 103/57  Pulse: 78  Weight: 162 lb (73.5 kg)    Fetal Status: Fetal Heart Rate (bpm): 134 Fundal Height: 37 cm Movement: Present     General:  Alert, oriented and cooperative. Patient is in no acute distress.  Skin: Skin is warm and dry. No rash noted.   Cardiovascular: Normal heart rate noted  Respiratory: Normal respiratory effort, no problems with respiration noted  Abdomen: Soft, gravid, appropriate for gestational age.  Pain/Pressure: Absent     Pelvic: Cervical exam performed Dilation: 1.5 Effacement (%): Thick Station: Ballotable  Extremities: Normal range of motion.  Edema: None  Mental Status:  Normal mood and affect. Normal behavior. Normal judgment and thought content.   Assessment and Plan:  Pregnancy: [id number] at 269w2d  1. Supervision of other normal pregnancy, antepartum  - GC[facility]Chlam[facility] amp [facility] - Culture, beta strep (group b only)  Term labor symptoms and general obstetric precautions including but not limited to  vaginal bleeding, contractions, leaking of fluid and fetal movement were reviewed in detail with the patient. Please refer to After Visit Summary for other counseling recommendations.  No Follow-up on file.   Venia CarbonJennifer Rasch, NP

## 2017-07-26 NOTE — Patient Instructions (Signed)

## 2017-07-28 LAB — GC/CHLAMYDIA PROBE AMP (~~LOC~~) NOT AT ARMC
Chlamydia: NEGATIVE
NEISSERIA GONORRHEA: NEGATIVE

## 2017-07-30 LAB — CULTURE, BETA STREP (GROUP B ONLY): Strep Gp B Culture: NEGATIVE

## 2017-08-02 ENCOUNTER — Ambulatory Visit (INDEPENDENT_AMBULATORY_CARE_PROVIDER_SITE_OTHER): Payer: Medicaid Other | Admitting: Obstetrics and Gynecology

## 2017-08-02 VITALS — BP 100/55 | HR 70 | Wt 164.5 lb

## 2017-08-02 DIAGNOSIS — Z3009 Encounter for other general counseling and advice on contraception: Secondary | ICD-10-CM

## 2017-08-02 DIAGNOSIS — Z3480 Encounter for supervision of other normal pregnancy, unspecified trimester: Secondary | ICD-10-CM

## 2017-08-02 NOTE — Progress Notes (Signed)
Subjective:  Emily Best is a 27 y.o. [phone] at 3860w2d being seen today for ongoing prenatal care.  She is currently monitored for the following issues for this low-risk pregnancy and has Supervision of other normal pregnancy, antepartum; Late prenatal care affecting pregnancy in second trimester; Pain in symphysis pubis during pregnancy; Round ligament pain; Hx of macrosomia in infant in prior pregnancy, currently pregnant, third trimester; and Unwanted fertility on their problem list.  Patient reports fatigue.  Contractions: Not present. Vag. Bleeding: None.  Movement: Present. Denies leaking of fluid.   The following portions of the patient's history were reviewed and updated as appropriate: allergies, current medications, past family history, past medical history, past social history, past surgical history and problem list. Problem list updated.  Objective:   Vitals:   08/02/17 0955  BP: (!) 100/55  Pulse: 70  Weight: 74.6 kg (164 lb 8 oz)    Fetal Status: Fetal Heart Rate (bpm): 125 Fundal Height: 38 cm Movement: Present     General:  Alert, oriented and cooperative. Patient is in no acute distress.  Skin: Skin is warm and dry. No rash noted.   Cardiovascular: Normal heart rate noted  Respiratory: Normal respiratory effort, no problems with respiration noted  Abdomen: Soft, gravid, appropriate for gestational age. Pain/Pressure: Present     Pelvic: Vag. Bleeding: None     Cervical exam deferred        Extremities: Normal range of motion.  Edema: Trace  Mental Status: Normal mood and affect. Normal behavior. Normal judgment and thought content.   Urinalysis:      Assessment and Plan:  Pregnancy: [id number] at 760w2d  1. Supervision of other normal pregnancy, antepartum Doing well. Continue care. GBS negative.  2. Unwanted fertility BTL consent has been signed.   Term labor symptoms and general obstetric precautions including but not limited to vaginal bleeding,  contractions, leaking of fluid and fetal movement were reviewed in detail with the patient. Please refer to After Visit Summary for other counseling recommendations.  Return in about 1 week (around 08/09/2017) for ob visit.   Pincus LargePhelps, Ellsie Violette Y, DO

## 2017-08-02 NOTE — Patient Instructions (Signed)

## 2017-08-09 ENCOUNTER — Ambulatory Visit (INDEPENDENT_AMBULATORY_CARE_PROVIDER_SITE_OTHER): Payer: Medicaid Other | Admitting: Family Medicine

## 2017-08-09 VITALS — BP 115/68 | HR 102 | Wt 164.3 lb

## 2017-08-09 DIAGNOSIS — Z3483 Encounter for supervision of other normal pregnancy, third trimester: Secondary | ICD-10-CM

## 2017-08-09 DIAGNOSIS — Z3480 Encounter for supervision of other normal pregnancy, unspecified trimester: Secondary | ICD-10-CM

## 2017-08-09 NOTE — Progress Notes (Signed)
   PRENATAL VISIT NOTE  Subjective:  Emily Best is a 27 y.o. [phone] at 7216w2d being seen today for ongoing prenatal care.  She is currently monitored for the following issues for this low-risk pregnancy and has Supervision of other normal pregnancy, antepartum; Late prenatal care affecting pregnancy in second trimester; Pain in symphysis pubis during pregnancy; Round ligament pain; Hx of macrosomia in infant in prior pregnancy, currently pregnant, third trimester; and Unwanted fertility on their problem list.  Patient reports no complaints.  Contractions: Not present. Vag. Bleeding: None.  Movement: Present. Denies leaking of fluid.   The following portions of the patient's history were reviewed and updated as appropriate: allergies, current medications, past family history, past medical history, past social history, past surgical history and problem list. Problem list updated.  Objective:   Vitals:   08/09/17 1406  BP: 115/68  Pulse: (!) 102  Weight: 74.5 kg (164 lb 4.8 oz)    Fetal Status: Fetal Heart Rate (bpm): 142 Fundal Height: 38 cm Movement: Present  Presentation: Vertex  General:  Alert, oriented and cooperative. Patient is in no acute distress.  Skin: Skin is warm and dry. No rash noted.   Cardiovascular: Normal heart rate noted  Respiratory: Normal respiratory effort, no problems with respiration noted  Abdomen: Soft, gravid, appropriate for gestational age.  Pain/Pressure: Present     Pelvic: Cervical exam deferred        Extremities: Normal range of motion.  Edema: None  Mental Status:  Normal mood and affect. Normal behavior. Normal judgment and thought content.   Assessment and Plan:  Pregnancy: [id number] at 7516w2d  1. Supervision of other normal pregnancy, antepartum   Term labor symptoms and general obstetric precautions including but not limited to vaginal bleeding, contractions, leaking of fluid and fetal movement were reviewed in detail with the  patient. Please refer to After Visit Summary for other counseling recommendations.  Return in about 1 week (around 08/16/2017).   Rolm BookbinderAmber Devontaye Ground, DO

## 2017-08-09 NOTE — Patient Instructions (Signed)
Braxton Hicks Contractions °Contractions of the uterus can occur throughout pregnancy, but they are not always a sign that you are in labor. You may have practice contractions called Braxton Hicks contractions. These false labor contractions are sometimes confused with true labor. °What are Braxton Hicks contractions? °Braxton Hicks contractions are tightening movements that occur in the muscles of the uterus before labor. Unlike true labor contractions, these contractions do not result in opening (dilation) and thinning of the cervix. Toward the end of pregnancy (32-34 weeks), Braxton Hicks contractions can happen more often and may become stronger. These contractions are sometimes difficult to tell apart from true labor because they can be very uncomfortable. You should not feel embarrassed if you go to the hospital with false labor. °Sometimes, the only way to tell if you are in true labor is for your health care provider to look for changes in the cervix. The health care provider will do a physical exam and may monitor your contractions. If you are not in true labor, the exam should show that your cervix is not dilating and your water has not broken. °If there are other health problems associated with your pregnancy, it is completely safe for you to be sent home with false labor. You may continue to have Braxton Hicks contractions until you go into true labor. °How to tell the difference between true labor and false labor °True labor °· Contractions last 30-70 seconds. °· Contractions become very regular. °· Discomfort is usually felt in the top of the uterus, and it spreads to the lower abdomen and low back. °· Contractions do not go away with walking. °· Contractions usually become more intense and increase in frequency. °· The cervix dilates and gets thinner. °False labor °· Contractions are usually shorter and not as strong as true labor contractions. °· Contractions are usually irregular. °· Contractions  are often felt in the front of the lower abdomen and in the groin. °· Contractions may go away when you walk around or change positions while lying down. °· Contractions get weaker and are shorter-lasting as time goes on. °· The cervix usually does not dilate or become thin. °Follow these instructions at home: °· Take over-the-counter and prescription medicines only as told by your health care provider. °· Keep up with your usual exercises and follow other instructions from your health care provider. °· Eat and drink lightly if you think you are going into labor. °· If Braxton Hicks contractions are making you uncomfortable: °? Change your position from lying down or resting to walking, or change from walking to resting. °? Sit and rest in a tub of warm water. °? Drink enough fluid to keep your urine pale yellow. Dehydration may cause these contractions. °? Do slow and deep breathing several times an hour. °· Keep all follow-up prenatal visits as told by your health care provider. This is important. °Contact a health care provider if: °· You have a fever. °· You have continuous pain in your abdomen. °Get help right away if: °· Your contractions become stronger, more regular, and closer together. °· You have fluid leaking or gushing from your vagina. °· You pass blood-tinged mucus (bloody show). °· You have bleeding from your vagina. °· You have low back pain that you never had before. °· You feel your baby’s head pushing down and causing pelvic pressure. °· Your baby is not moving inside you as much as it used to. °Summary °· Contractions that occur before labor are called Braxton   Hicks contractions, false labor, or practice contractions. °· Braxton Hicks contractions are usually shorter, weaker, farther apart, and less regular than true labor contractions. True labor contractions usually become progressively stronger and regular and they become more frequent. °· Manage discomfort from Braxton Hicks contractions by  changing position, resting in a warm bath, drinking plenty of water, or practicing deep breathing. °This information is not intended to replace advice given to you by your health care provider. Make sure you discuss any questions you have with your health care provider. °Document Released: 09/23/2016 Document Revised: 09/23/2016 Document Reviewed: 09/23/2016 °Elsevier Interactive Patient Education © 2018 [employer] Inc. ° °

## 2017-08-16 ENCOUNTER — Telehealth (HOSPITAL_COMMUNITY): Payer: Self-pay | Admitting: *Deleted

## 2017-08-16 ENCOUNTER — Telehealth: Payer: Self-pay

## 2017-08-16 ENCOUNTER — Encounter: Payer: Medicaid Other | Admitting: Family Medicine

## 2017-08-16 ENCOUNTER — Ambulatory Visit (INDEPENDENT_AMBULATORY_CARE_PROVIDER_SITE_OTHER): Payer: Medicaid Other | Admitting: Family Medicine

## 2017-08-16 ENCOUNTER — Encounter (HOSPITAL_COMMUNITY): Payer: Self-pay | Admitting: *Deleted

## 2017-08-16 VITALS — BP 110/54 | HR 85 | Wt 166.8 lb

## 2017-08-16 DIAGNOSIS — O09293 Supervision of pregnancy with other poor reproductive or obstetric history, third trimester: Secondary | ICD-10-CM

## 2017-08-16 DIAGNOSIS — O0932 Supervision of pregnancy with insufficient antenatal care, second trimester: Secondary | ICD-10-CM

## 2017-08-16 DIAGNOSIS — Z3009 Encounter for other general counseling and advice on contraception: Secondary | ICD-10-CM

## 2017-08-16 DIAGNOSIS — Z3480 Encounter for supervision of other normal pregnancy, unspecified trimester: Secondary | ICD-10-CM

## 2017-08-16 DIAGNOSIS — Z3483 Encounter for supervision of other normal pregnancy, third trimester: Secondary | ICD-10-CM

## 2017-08-16 NOTE — Telephone Encounter (Signed)
Preadmission screen  

## 2017-08-16 NOTE — Telephone Encounter (Signed)
Clld pt to inform of induction date & time/ no answer had to leave message on VM.Date of Induction is 08/28/17 !@ Midnight.

## 2017-08-16 NOTE — Patient Instructions (Signed)
Braxton Hicks Contractions °Contractions of the uterus can occur throughout pregnancy, but they are not always a sign that you are in labor. You may have practice contractions called Braxton Hicks contractions. These false labor contractions are sometimes confused with true labor. °What are Braxton Hicks contractions? °Braxton Hicks contractions are tightening movements that occur in the muscles of the uterus before labor. Unlike true labor contractions, these contractions do not result in opening (dilation) and thinning of the cervix. Toward the end of pregnancy (32-34 weeks), Braxton Hicks contractions can happen more often and may become stronger. These contractions are sometimes difficult to tell apart from true labor because they can be very uncomfortable. You should not feel embarrassed if you go to the hospital with false labor. °Sometimes, the only way to tell if you are in true labor is for your health care provider to look for changes in the cervix. The health care provider will do a physical exam and may monitor your contractions. If you are not in true labor, the exam should show that your cervix is not dilating and your water has not broken. °If there are other health problems associated with your pregnancy, it is completely safe for you to be sent home with false labor. You may continue to have Braxton Hicks contractions until you go into true labor. °How to tell the difference between true labor and false labor °True labor °· Contractions last 30-70 seconds. °· Contractions become very regular. °· Discomfort is usually felt in the top of the uterus, and it spreads to the lower abdomen and low back. °· Contractions do not go away with walking. °· Contractions usually become more intense and increase in frequency. °· The cervix dilates and gets thinner. °False labor °· Contractions are usually shorter and not as strong as true labor contractions. °· Contractions are usually irregular. °· Contractions  are often felt in the front of the lower abdomen and in the groin. °· Contractions may go away when you walk around or change positions while lying down. °· Contractions get weaker and are shorter-lasting as time goes on. °· The cervix usually does not dilate or become thin. °Follow these instructions at home: °· Take over-the-counter and prescription medicines only as told by your health care provider. °· Keep up with your usual exercises and follow other instructions from your health care provider. °· Eat and drink lightly if you think you are going into labor. °· If Braxton Hicks contractions are making you uncomfortable: °? Change your position from lying down or resting to walking, or change from walking to resting. °? Sit and rest in a tub of warm water. °? Drink enough fluid to keep your urine pale yellow. Dehydration may cause these contractions. °? Do slow and deep breathing several times an hour. °· Keep all follow-up prenatal visits as told by your health care provider. This is important. °Contact a health care provider if: °· You have a fever. °· You have continuous pain in your abdomen. °Get help right away if: °· Your contractions become stronger, more regular, and closer together. °· You have fluid leaking or gushing from your vagina. °· You pass blood-tinged mucus (bloody show). °· You have bleeding from your vagina. °· You have low back pain that you never had before. °· You feel your baby’s head pushing down and causing pelvic pressure. °· Your baby is not moving inside you as much as it used to. °Summary °· Contractions that occur before labor are called Braxton   Hicks contractions, false labor, or practice contractions. °· Braxton Hicks contractions are usually shorter, weaker, farther apart, and less regular than true labor contractions. True labor contractions usually become progressively stronger and regular and they become more frequent. °· Manage discomfort from Braxton Hicks contractions by  changing position, resting in a warm bath, drinking plenty of water, or practicing deep breathing. °This information is not intended to replace advice given to you by your health care provider. Make sure you discuss any questions you have with your health care provider. °Document Released: 09/23/2016 Document Revised: 09/23/2016 Document Reviewed: 09/23/2016 °Elsevier Interactive Patient Education © 2018 [employer] Inc. ° °

## 2017-08-16 NOTE — Progress Notes (Signed)
   PRENATAL VISIT NOTE  Subjective:  Emily Best is a 27 y.o. [phone] at 6354w2d being seen today for ongoing prenatal care.  She is currently monitored for the following issues for this low-risk pregnancy an d has Supervision of other normal pregnancy, antepartum; Late prenatal care affecting pregnancy in second trimester; Pain in symphysis pubis during pregnancy; Round ligament pain; Hx of macrosomia in infant in prior pregnancy, currently pregnant, third trimester; and Unwanted fertility on their problem list.  Patient reports no complaints.  Contractions: Not present. Vag. Bleeding: None.  Movement: Present. Denies leaking of fluid.   The following portions of the patient's history were reviewed and updated as appropriate: allergies, current medications, past family history, past medical history, past social history, past surgical history and problem list. Problem list updated.  Objective:   Vitals:   08/16/17 1354  BP: (!) 110/54  Pulse: 85  Weight: 166 lb 12.8 oz (75.7 kg)    Fetal Status: Fetal Heart Rate (bpm): 146   Movement: Present     General:  Alert, oriented and cooperative. Patient is in no acute distress.  Skin: Skin is warm and dry. No rash noted.   Cardiovascular: Normal heart rate noted  Respiratory: Normal respiratory effort, no problems with respiration noted  Abdomen: Soft, gravid, appropriate for gestational age.  Pain/Pressure: Present     Pelvic: Cervical exam deferred        Extremities: Normal range of motion.  Edema: Trace  Mental Status:  Normal mood and affect. Normal behavior. Normal judgment and thought content.   Assessment and Plan:  Pregnancy: [id number] at 4354w2d  1. Supervision of other normal pregnancy, antepartum Routine care GBS neg Schedule PD IOL for 41 weeks (orders placed)   2. Late prenatal care affecting pregnancy in second trimester  3. Hx of macrosomia in infant in prior pregnancy, currently pregnant, third trimester EWF 66th %ile   At 31 weeks. Size has been c/w dates  4. Unwanted fertility BTL papers have been sgiend  Term labor symptoms and general obstetric precautions including but not limited to vaginal bleeding, contractions, leaking of fluid and fetal movement were reviewed in detail with the patient. Please refer to After Visit Summary for other counseling recommendations.  Return in about 1 week (around 08/23/2017) for LOB.   Frederik PearJulie P Ivyrose Hashman, MD   Future Appointments  Date Time Provider Department Center  08/23/2017  9:35 AM Magnus SinningWenzel, Sherryll BurgerJulie N, PA-C [facility]

## 2017-08-22 ENCOUNTER — Encounter: Payer: Self-pay | Admitting: *Deleted

## 2017-08-23 ENCOUNTER — Inpatient Hospital Stay (HOSPITAL_COMMUNITY): Payer: Medicaid Other | Admitting: Anesthesiology

## 2017-08-23 ENCOUNTER — Encounter (HOSPITAL_COMMUNITY)
Admission: AD | Disposition: A | Discharge status: Home / Self Care | Payer: Self-pay | Source: Ambulatory Visit | Attending: Family Medicine

## 2017-08-23 ENCOUNTER — Encounter (HOSPITAL_COMMUNITY): Payer: Self-pay | Admitting: *Deleted

## 2017-08-23 ENCOUNTER — Other Ambulatory Visit: Payer: Self-pay

## 2017-08-23 ENCOUNTER — Encounter: Payer: Medicaid Other | Admitting: Medical

## 2017-08-23 ENCOUNTER — Inpatient Hospital Stay (HOSPITAL_COMMUNITY)
Admission: AD | Admit: 2017-08-23 | Discharge: 2017-08-25 | DRG: 798 | Disposition: A | Discharge status: Home / Self Care | Payer: Medicaid Other | Source: Ambulatory Visit | Attending: Family Medicine | Admitting: Family Medicine

## 2017-08-23 DIAGNOSIS — O480 Post-term pregnancy: Secondary | ICD-10-CM | POA: Insufficient documentation

## 2017-08-23 DIAGNOSIS — Z3480 Encounter for supervision of other normal pregnancy, unspecified trimester: Secondary | ICD-10-CM

## 2017-08-23 DIAGNOSIS — O9902 Anemia complicating childbirth: Secondary | ICD-10-CM | POA: Diagnosis present

## 2017-08-23 DIAGNOSIS — Z302 Encounter for sterilization: Secondary | ICD-10-CM

## 2017-08-23 DIAGNOSIS — D649 Anemia, unspecified: Secondary | ICD-10-CM | POA: Diagnosis present

## 2017-08-23 DIAGNOSIS — M7918 Myalgia, other site: Secondary | ICD-10-CM

## 2017-08-23 DIAGNOSIS — Z3009 Encounter for other general counseling and advice on contraception: Secondary | ICD-10-CM

## 2017-08-23 DIAGNOSIS — O99891 Other specified diseases and conditions complicating pregnancy: Secondary | ICD-10-CM

## 2017-08-23 DIAGNOSIS — Z3A40 40 weeks gestation of pregnancy: Secondary | ICD-10-CM

## 2017-08-23 DIAGNOSIS — Z8279 Family history of other congenital malformations, deformations and chromosomal abnormalities: Secondary | ICD-10-CM

## 2017-08-23 DIAGNOSIS — Z3483 Encounter for supervision of other normal pregnancy, third trimester: Secondary | ICD-10-CM | POA: Diagnosis present

## 2017-08-23 DIAGNOSIS — N949 Unspecified condition associated with female genital organs and menstrual cycle: Secondary | ICD-10-CM

## 2017-08-23 DIAGNOSIS — O9989 Other specified diseases and conditions complicating pregnancy, childbirth and the puerperium: Secondary | ICD-10-CM

## 2017-08-23 DIAGNOSIS — O09293 Supervision of pregnancy with other poor reproductive or obstetric history, third trimester: Secondary | ICD-10-CM

## 2017-08-23 DIAGNOSIS — Z3A41 41 weeks gestation of pregnancy: Secondary | ICD-10-CM

## 2017-08-23 HISTORY — PX: TUBAL LIGATION: SHX77

## 2017-08-23 LAB — CBC
HCT: 35.2 % — ABNORMAL LOW (ref 36.0–46.0)
Hemoglobin: 11.8 g/dL — ABNORMAL LOW (ref 12.0–15.0)
MCH: 28.8 pg (ref 26.0–34.0)
MCHC: 33.5 g/dL (ref 30.0–36.0)
MCV: 85.9 fL (ref 78.0–100.0)
PLATELETS: 159 10*3/uL (ref 150–400)
RBC: 4.1 MIL/uL (ref 3.87–5.11)
RDW: 13.9 % (ref 11.5–15.5)
WBC: 7.8 10*3/uL (ref 4.0–10.5)

## 2017-08-23 LAB — TYPE AND SCREEN
ABO/RH(D): O POS
ANTIBODY SCREEN: NEGATIVE

## 2017-08-23 SURGERY — LIGATION, FALLOPIAN TUBE, POSTPARTUM
Anesthesia: General

## 2017-08-23 MED ORDER — PROPOFOL 10 MG/ML IV BOLUS
INTRAVENOUS | Status: AC
  Filled 2017-08-23: qty 20

## 2017-08-23 MED ORDER — FENTANYL CITRATE (PF) 250 MCG/5ML IJ SOLN
INTRAMUSCULAR | Status: AC
  Filled 2017-08-23: qty 5

## 2017-08-23 MED ORDER — FLEET ENEMA 7-19 GM/118ML RE ENEM: 1.0000 | ENEMA | RECTAL | Status: DC | PRN

## 2017-08-23 MED ORDER — MEPERIDINE HCL 25 MG/ML IJ SOLN: 6.2500 mg | INTRAMUSCULAR | Status: DC | PRN

## 2017-08-23 MED ORDER — MENTHOL 3 MG MT LOZG
1.0000 | LOZENGE | OROMUCOSAL | Status: DC | PRN
  Administered 2017-08-23: 3 mg via ORAL
  Filled 2017-08-23: qty 9

## 2017-08-23 MED ORDER — MIDAZOLAM HCL 2 MG/2ML IJ SOLN
INTRAMUSCULAR | Status: DC | PRN
  Administered 2017-08-23: 2 mg via INTRAVENOUS

## 2017-08-23 MED ORDER — ONDANSETRON HCL 4 MG/2ML IJ SOLN
INTRAMUSCULAR | Status: DC | PRN
  Administered 2017-08-23: 4 mg via INTRAVENOUS

## 2017-08-23 MED ORDER — PRENATAL MULTIVITAMIN CH
1.0000 | ORAL_TABLET | Freq: Every day | ORAL | Status: DC
  Administered 2017-08-24: 1 via ORAL

## 2017-08-23 MED ORDER — ACETAMINOPHEN 325 MG PO TABS: 650.0000 mg | ORAL_TABLET | ORAL | Status: DC | PRN

## 2017-08-23 MED ORDER — BUPIVACAINE HCL (PF) 0.25 % IJ SOLN
INTRAMUSCULAR | Status: AC
  Filled 2017-08-23: qty 30

## 2017-08-23 MED ORDER — OXYCODONE-ACETAMINOPHEN 5-325 MG PO TABS: 2.0000 | ORAL_TABLET | ORAL | Status: DC | PRN

## 2017-08-23 MED ORDER — HYDROCODONE-ACETAMINOPHEN 7.5-325 MG PO TABS: 1.0000 | ORAL_TABLET | Freq: Once | ORAL | Status: DC | PRN

## 2017-08-23 MED ORDER — ONDANSETRON HCL 4 MG/2ML IJ SOLN: 4.0000 mg | Freq: Four times a day (QID) | INTRAMUSCULAR | Status: DC | PRN

## 2017-08-23 MED ORDER — LIDOCAINE HCL (CARDIAC) 20 MG/ML IV SOLN
INTRAVENOUS | Status: AC
  Filled 2017-08-23: qty 5

## 2017-08-23 MED ORDER — ONDANSETRON HCL 4 MG/2ML IJ SOLN: 4.0000 mg | INTRAMUSCULAR | Status: DC | PRN

## 2017-08-23 MED ORDER — FENTANYL CITRATE (PF) 100 MCG/2ML IJ SOLN
100.0000 ug | INTRAMUSCULAR | Status: DC | PRN
  Administered 2017-08-23 (×4): 100 ug via INTRAVENOUS
  Filled 2017-08-23 (×2): qty 2

## 2017-08-23 MED ORDER — OXYCODONE HCL 5 MG PO TABS
5.0000 mg | ORAL_TABLET | ORAL | Status: DC | PRN
  Administered 2017-08-24 – 2017-08-25 (×5): 5 mg via ORAL
  Filled 2017-08-23 (×5): qty 1

## 2017-08-23 MED ORDER — BUPIVACAINE HCL (PF) 0.25 % IJ SOLN
INTRAMUSCULAR | Status: DC | PRN
  Administered 2017-08-23: 10 mL

## 2017-08-23 MED ORDER — ONDANSETRON HCL 4 MG PO TABS: 4.0000 mg | ORAL_TABLET | ORAL | Status: DC | PRN

## 2017-08-23 MED ORDER — ZOLPIDEM TARTRATE 5 MG PO TABS: 5.0000 mg | ORAL_TABLET | Freq: Every evening | ORAL | Status: DC | PRN

## 2017-08-23 MED ORDER — SUCCINYLCHOLINE CHLORIDE 20 MG/ML IJ SOLN
INTRAMUSCULAR | Status: DC | PRN
  Administered 2017-08-23: 100 mg via INTRAVENOUS

## 2017-08-23 MED ORDER — LIDOCAINE HCL (PF) 1 % IJ SOLN
INTRAMUSCULAR | Status: DC | PRN
  Administered 2017-08-23: 60 mL

## 2017-08-23 MED ORDER — TETANUS-DIPHTH-ACELL PERTUSSIS 5-2.5-18.5 LF-MCG/0.5 IM SUSP: 0.5000 mL | Freq: Once | INTRAMUSCULAR | Status: DC

## 2017-08-23 MED ORDER — COCONUT OIL OIL: 1.0000 "application " | TOPICAL_OIL | Status: DC | PRN

## 2017-08-23 MED ORDER — SIMETHICONE 80 MG PO CHEW: 80.0000 mg | CHEWABLE_TABLET | ORAL | Status: DC | PRN

## 2017-08-23 MED ORDER — SOD CITRATE-CITRIC ACID 500-334 MG/5ML PO SOLN
30.0000 mL | ORAL | Status: DC | PRN
  Administered 2017-08-23: 30 mL via ORAL
  Filled 2017-08-23: qty 15

## 2017-08-23 MED ORDER — OXYTOCIN BOLUS FROM INFUSION
500.0000 mL | Freq: Once | INTRAVENOUS | Status: AC
  Administered 2017-08-23: 500 mL via INTRAVENOUS

## 2017-08-23 MED ORDER — DIBUCAINE 1 % RE OINT: 1.0000 "application " | TOPICAL_OINTMENT | RECTAL | Status: DC | PRN

## 2017-08-23 MED ORDER — OXYCODONE-ACETAMINOPHEN 5-325 MG PO TABS: 1.0000 | ORAL_TABLET | ORAL | Status: DC | PRN

## 2017-08-23 MED ORDER — WITCH HAZEL-GLYCERIN EX PADS: 1.0000 "application " | MEDICATED_PAD | CUTANEOUS | Status: DC | PRN

## 2017-08-23 MED ORDER — SENNOSIDES-DOCUSATE SODIUM 8.6-50 MG PO TABS
2.0000 | ORAL_TABLET | ORAL | Status: DC
  Administered 2017-08-23 – 2017-08-24 (×2): 2 via ORAL
  Filled 2017-08-23 (×2): qty 2

## 2017-08-23 MED ORDER — SUCCINYLCHOLINE CHLORIDE 200 MG/10ML IV SOSY
PREFILLED_SYRINGE | INTRAVENOUS | Status: AC
  Filled 2017-08-23: qty 10

## 2017-08-23 MED ORDER — LIDOCAINE HCL (PF) 1 % IJ SOLN
30.0000 mL | INTRAMUSCULAR | Status: DC | PRN
  Filled 2017-08-23: qty 30

## 2017-08-23 MED ORDER — PROPOFOL 10 MG/ML IV BOLUS
INTRAVENOUS | Status: DC | PRN
  Administered 2017-08-23: 160 mg via INTRAVENOUS

## 2017-08-23 MED ORDER — LACTATED RINGERS IV SOLN
INTRAVENOUS | Status: DC
  Administered 2017-08-23: 10:00:00 via INTRAVENOUS

## 2017-08-23 MED ORDER — ONDANSETRON HCL 4 MG/2ML IJ SOLN
INTRAMUSCULAR | Status: AC
  Filled 2017-08-23: qty 2

## 2017-08-23 MED ORDER — HYDROMORPHONE HCL 1 MG/ML IJ SOLN: 0.2500 mg | INTRAMUSCULAR | Status: DC | PRN

## 2017-08-23 MED ORDER — IBUPROFEN 600 MG PO TABS
600.0000 mg | ORAL_TABLET | Freq: Four times a day (QID) | ORAL | Status: DC
Start: 2017-08-23 — End: 2017-08-25
  Administered 2017-08-23 – 2017-08-25 (×6): 600 mg via ORAL
  Filled 2017-08-23 (×5): qty 1

## 2017-08-23 MED ORDER — MIDAZOLAM HCL 2 MG/2ML IJ SOLN
INTRAMUSCULAR | Status: AC
  Filled 2017-08-23: qty 2

## 2017-08-23 MED ORDER — OXYTOCIN 40 UNITS IN LACTATED RINGERS INFUSION - SIMPLE MED
2.5000 [IU]/h | INTRAVENOUS | Status: DC
  Administered 2017-08-23: 2.5 [IU]/h via INTRAVENOUS
  Filled 2017-08-23: qty 1000

## 2017-08-23 MED ORDER — ACETAMINOPHEN 325 MG PO TABS
650.0000 mg | ORAL_TABLET | ORAL | Status: DC | PRN
  Administered 2017-08-23: 650 mg via ORAL
  Filled 2017-08-23: qty 2

## 2017-08-23 MED ORDER — BENZOCAINE-MENTHOL 20-0.5 % EX AERO: 1.0000 "application " | INHALATION_SPRAY | CUTANEOUS | Status: DC | PRN

## 2017-08-23 MED ORDER — DIPHENHYDRAMINE HCL 25 MG PO CAPS: 25.0000 mg | ORAL_CAPSULE | Freq: Four times a day (QID) | ORAL | Status: DC | PRN

## 2017-08-23 MED ORDER — METOCLOPRAMIDE HCL 5 MG/ML IJ SOLN: 10.0000 mg | Freq: Once | INTRAMUSCULAR | Status: DC | PRN

## 2017-08-23 MED ORDER — LACTATED RINGERS IV SOLN: 500.0000 mL | INTRAVENOUS | Status: DC | PRN

## 2017-08-23 SURGICAL SUPPLY — 25 items
BLADE SURG 11 STRL SS (BLADE) ×3 IMPLANT
CLIP FILSHIE TUBAL LIGA STRL (Clip) ×6 IMPLANT
CLOTH BEACON ORANGE TIMEOUT ST (SAFETY) ×3 IMPLANT
DERMABOND ADHESIVE PROPEN (GAUZE/BANDAGES/DRESSINGS) ×2
DERMABOND ADVANCED .7 DNX6 (GAUZE/BANDAGES/DRESSINGS) ×1 IMPLANT
DRESSING OPSITE X SMALL 2X3 (GAUZE/BANDAGES/DRESSINGS) ×3 IMPLANT
DRSG OPSITE POSTOP 3X4 (GAUZE/BANDAGES/DRESSINGS) ×3 IMPLANT
DURAPREP 26ML APPLICATOR (WOUND CARE) ×3 IMPLANT
GLOVE BIOGEL PI IND STRL 7.0 (GLOVE) ×1 IMPLANT
GLOVE BIOGEL PI IND STRL 7.5 (GLOVE) ×1 IMPLANT
GLOVE BIOGEL PI INDICATOR 7.0 (GLOVE) ×2
GLOVE BIOGEL PI INDICATOR 7.5 (GLOVE) ×2
GLOVE ECLIPSE 7.5 STRL STRAW (GLOVE) ×3 IMPLANT
GOWN STRL REUS W/TWL LRG LVL3 (GOWN DISPOSABLE) ×6 IMPLANT
NEEDLE HYPO 22GX1.5 SAFETY (NEEDLE) ×3 IMPLANT
NS IRRIG 1000ML POUR BTL (IV SOLUTION) ×3 IMPLANT
PACK ABDOMINAL MINOR (CUSTOM PROCEDURE TRAY) ×3 IMPLANT
PROTECTOR NERVE ULNAR (MISCELLANEOUS) ×3 IMPLANT
SPONGE LAP 4X18 X RAY DECT (DISPOSABLE) IMPLANT
SUT VICRYL 0 UR6 27IN ABS (SUTURE) ×3 IMPLANT
SUT VICRYL 4-0 PS2 18IN ABS (SUTURE) ×3 IMPLANT
SYR CONTROL 10ML LL (SYRINGE) ×3 IMPLANT
TOWEL OR 17X24 6PK STRL BLUE (TOWEL DISPOSABLE) ×6 IMPLANT
TRAY FOLEY CATH SILVER 14FR (SET/KITS/TRAYS/PACK) ×3 IMPLANT
WATER STERILE IRR 1000ML POUR (IV SOLUTION) ×3 IMPLANT

## 2017-08-23 NOTE — Anesthesia Pain Management Evaluation Note (Signed)
  CRNA Pain Management Visit Note  Patient: Emily Best, 27 y.o., female  "Hello I am a [facility] of the anesthesia team [facility]. We have an anesthesia team available at all times to provide care throughout the hospital, including epidural management and anesthesia for C-section. I don't know your plan for the delivery whether it a natural birth, water birth, IV sedation, nitrous supplementation, doula or epidural, but we want to meet your pain goals."   1.Was your pain managed to your expectations on prior hospitalizations?   Yes   2.What is your expectation for pain management during this hospitalization?     IV pain meds  3.How can we help you reach that goal? unsure  Record the patient's initial score and the patient's pain goal.   Pain: 8  Pain Goal: 10 [facility] wants you to be able to say your pain was always managed very well.  Emily Best,Emily Best 08/23/2017

## 2017-08-23 NOTE — Progress Notes (Signed)
Patient Name: Lance BoschDominique Jilek, female   DOB: 09/29/1990, 29 y.o.  MRN: [id number]  Risks of procedure discussed with patient including but not limited to: risk of regret, permanence of method, bleeding, infection, injury to surrounding organs and need for additional procedures.  Failure risk of 1 -2 % with increased risk of ectopic gestation if pregnancy occurs was also discussed with patient.    Levie HeritageStinson, Luvern Mcisaac J, DO 08/23/2017 4:41 PM
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
## 2017-08-23 NOTE — Anesthesia Preprocedure Evaluation (Addendum)
Anesthesia Evaluation  Patient identified by MRN, date of birth, ID band Patient awake    Reviewed: Allergy & Precautions, NPO status , Patient's Chart, lab work & pertinent test results  Airway Mallampati: II  TM Distance: >3 FB Neck ROM: Full    Dental no notable dental hx. (+) Teeth Intact   Pulmonary neg pulmonary ROS,    Pulmonary exam normal breath sounds clear to auscultation       Cardiovascular negative cardio ROS Normal cardiovascular exam Rhythm:Regular Rate:Normal     Neuro/Psych negative neurological ROS  negative psych ROS   GI/Hepatic Neg liver ROS,   Endo/Other  negative endocrine ROS  Renal/GU negative Renal ROS  negative genitourinary   Musculoskeletal negative musculoskeletal ROS (+)   Abdominal (+) - obese,   Peds  Hematology  (+) anemia ,   Anesthesia Other Findings   Reproductive/Obstetrics Desires sterilization                             Anesthesia Physical Anesthesia Plan  ASA: II  Anesthesia Plan: General   Post-op Pain Management:    Induction: Intravenous and Cricoid pressure planned  PONV Risk Score and Plan: Propofol infusion, Ondansetron, Dexamethasone, Treatment may vary due to age or medical condition, Scopolamine patch - Pre-op and Midazolam  Airway Management Planned: Oral ETT  Additional Equipment:   Intra-op Plan:   Post-operative Plan: Extubation in OR  Informed Consent: I have reviewed the patients History and Physical, chart, labs and discussed the procedure including the risks, benefits and alternatives for the proposed anesthesia with the patient or authorized representative who has indicated his/her understanding and acceptance.   Dental advisory given  Plan Discussed with: CRNA, Anesthesiologist and Surgeon  Anesthesia Plan Comments:        Anesthesia Quick Evaluation

## 2017-08-23 NOTE — Anesthesia Procedure Notes (Signed)
Procedure Name: Intubation Date/Time: 08/23/2017 5:20 PM Performed by: Asher Muir, CRNA Pre-anesthesia Checklist: Patient identified, Emergency Drugs available, Suction available and Patient being monitored Patient Re-evaluated:Patient Re-evaluated prior to induction Oxygen Delivery Method: Circle system utilized Preoxygenation: Pre-oxygenation with 100% oxygen Induction Type: IV induction and Cricoid Pressure applied Ventilation: Mask ventilation without difficulty Laryngoscope Size: Mac and 3 Grade View: Grade II Tube size: 7.0 mm Number of attempts: 1 Airway Equipment and Method: Rigid stylet Placement Confirmation: ETT inserted through vocal cords under direct vision,  positive ETCO2 and breath sounds checked- equal and bilateral Secured at: 20 cm Tube secured with: Tape Dental Injury: Teeth and Oropharynx as per pre-operative assessment

## 2017-08-23 NOTE — Op Note (Signed)
Lance BoschDominique Ashenfelter 08/23/2017  PREOPERATIVE DIAGNOSIS:  Undesired fertility  POSTOPERATIVE DIAGNOSIS:  Undesired fertility  PROCEDURE:  Postpartum Bilateral Tubal Sterilization using Filshie Clips   SURGEON: Surgeon(s) and Role:    * Levie HeritageStinson, Jacob J, DO - Primary    * Degele, Kandra NicolasJulie P, MD - OB Fellow  ANESTHESIA:  General  COMPLICATIONS:  None immediate.  ESTIMATED BLOOD LOSS:  5 mL  FLUIDS: 900 mL LR.  URINE OUTPUT:  550 mL of clear urine.  INDICATIONS: 27 y.o. yo [phone]  with undesired fertility,status post vaginal delivery, desires permanent sterilization. Risks and benefits of procedure discussed with patient including permanence of method, bleeding, infection, injury to surrounding organs and need for additional procedures. Risk failure of 0.5-1% with increased risk of ectopic gestation if pregnancy occurs was also discussed with patient.   FINDINGS:  Normal uterus, tubes, and ovaries.  TECHNIQUE:  The patient was taken to the operating room where she was then placed in the dorsal supine position, general anesthesia was administered, and she was prepped and draped in sterile fashion.  After an adequate timeout was performed, attention was turned to the patient's abdomen where a small transverse skin incision was made at the umbilicus. The incision was taken down to the layer of fascia using the scalpel, and fascia was incised, and peritoneum was entered in a blunt fashion. Attention was then turned to the patient's uterus, and right fallopian tube was identified and followed out to the fimbriated end.  A Filshie clip was placed on the left fallopian tube about 2 cm from the cornual attachment, with care given to incorporate the underlying mesosalpinx.  A similar process was carried out on the left side allowing for bilateral tubal sterilization.  Good hemostasis was noted overall.The instruments were then removed from the patient's abdomen and the fascial incision was repaired with 0  Vicryl, and the skin was closed with a 4-0 Vicril subcuticular stitch and dermabond applied. The patient tolerated the procedure well.  Sponge, lap, and needle counts were correct times two.  The patient was then taken to the recovery room awake, extubated and in stable condition.   Degele, Kandra NicolasJulie P, MD OB Fellow 08/23/2017 5:48 PM

## 2017-08-23 NOTE — Transfer of Care (Signed)
Immediate Anesthesia Transfer of Care Note  Patient: Emily BoschDominique Duling  Procedure(s) Performed: POST PARTUM TUBAL LIGATION (N/A )  Patient Location: PACU  Anesthesia Type:General  Level of Consciousness: sedated  Airway & Oxygen Therapy: Patient Spontanous Breathing and Patient connected to nasal cannula oxygen  Post-op Assessment: Report given to RN  Post vital signs: Reviewed and stable  Last Vitals:  Vitals Value Taken Time  BP    Temp    Pulse    Resp    SpO2      Last Pain:  Vitals:   08/23/17 1403  TempSrc: Oral  PainSc:          Complications: No apparent anesthesia complications

## 2017-08-23 NOTE — Progress Notes (Signed)
LABOR PROGRESS NOTE  Emily Best is a 27 y.o. [phone] at 1972w2d  admitted for SOL  Subjective: Doing well w/o pain meds.   Objective: BP (!) 100/55   Pulse 87   Temp 97.8 F (36.6 C) (Oral)   Resp 18   Ht 5\' 10"  (1.778 m)   Wt 165 lb 8 oz (75.1 kg)   LMP 10/08/2016   SpO2 100%   BMI 23.75 kg/m  or  Vitals:   08/23/17 0727 08/23/17 0732 08/23/17 1403  BP:  107/67 (!) 100/55  Pulse:  78 87  Resp:  20 18  Temp:  97.8 F (36.6 C) 97.8 F (36.6 C)  TempSrc:  Oral Oral  SpO2:  100%   Weight: 165 lb 8 oz (75.1 kg)    Height: 5\' 10"  (1.778 m)      Dilation: 7 Effacement (%): 100 Cervical Position: Middle Station: 0 Presentation: Vertex Exam by:: Dr Nira Retortegele FHT: baseline rate 120, moderate varibility, +acel, occasional variable decel Toco: ctx q2-5 min   Assessment / Plan: 27 y.o. [id number] at 2972w2d here for SOL.  Labor: AROM, clear fluid. Expectant mangement Fetal Wellbeing:  Cat II (reactive with occasional variable) Pain Control:  Labor support w/o meds per patient's request Anticipated MOD:  SVD  Emily PearJulie P Akshitha Culmer, MD 08/23/2017, 2:16 PM

## 2017-08-23 NOTE — Progress Notes (Signed)
   08/23/17 0754  Fetal Heart Rate A  Mode External  Baseline Rate (A) 140 bpm  Variability 6-25 BPM  Accelerations 10 x 10  Decelerations None  Uterine Activity  Mode Toco  Contraction Frequency (min) 3-4  Contraction Duration (sec) 60-110  Contraction Quality Mild;Moderate  Resting Tone Palpated Relaxed  Resting Time Adequate  Cervical Exam  Dilation 4  Effacement (%) 60  Cervical Position Posterior  Cervical Consistency Soft  Vag. Bleeding None  Station -2  Presentation Vertex  Exam by: Claudette Lawsjaton Illona Bulman rnc  Report called to Dr Primitivo GauzeFletcher (resident) on the above assessment to include that pt is GBS neg & desires BTL; that pt was checked in office approx 2 weeks ago & was 1.5cm dilated.  MD states he will give report to provider & call back.

## 2017-08-23 NOTE — MAU Note (Addendum)
Pt presents with c/o ctxs every 7 minutes since 0100 this morning.  Denies VB or LOF.  States lost mucus plug. Reports +FM.

## 2017-08-23 NOTE — H&P (Addendum)
OBSTETRIC ADMISSION HISTORY AND PHYSICAL  Emily BoschDominique Best is a 27 y.o. female [phone] with IUP at 2771w2d by U/S presenting for labor. She reports +FMs, No LOF, no VB, no blurry vision, headaches or peripheral edema, and RUQ pain.  She plans on breast feeding. She request BTL for birth control. [facility]   Dating: By U/S --->  Estimated Date of Delivery: 08/21/17  Sono:   @[redacted]w[redacted]d , CWD, slight pyelectasis left kidney, cephalic presentation. Recommended f/u at 28 weeks to reassess kidney @31 .1 no issues. Still cephalid with normal anatomy   Clinic  [facility] Prenatal Labs  Dating  US Blood type: O/Positive/-- (11/27 [id number])   Genetic Screen     NIPS: panorama normal Antibody:Negative (11/27 [id number])  Anatomic US  L UTD A1 - Female [X]  F/U after 28 weeks-scheduled -- UTD resolved Rubella: 4.56 (11/27 0842)  GTT Early: N/A             Third trimester: passed RPR: Non Reactive (11/27 0842)   Flu vaccine  Declines HBsAg: Negative (11/27 0842) negative  TDaP vaccine        05/31/17                          Rhogam: NA HIV:  negative  Baby Food  Unsure                                          GBS: negative  Contraception  desires BTL [X]  consent signed 06/28/17 Pap: 04/19/17 - normal  Circumcision  N/A   Pediatrician  [facility] CF:  Support Person  [facility]  Prenatal Classes  N/A Hgb electrophoresis: normal    Prenatal History/Complications:  Past Medical History: Past Medical History:  Diagnosis Date  . Medical history non-contributory     Past Surgical History: History reviewed. No pertinent surgical history.  Obstetrical History: OB History    Gravida  4   Para  3   Term  3   Preterm      AB      Living  2     SAB      TAB      Ectopic      Multiple  0   Live Births  3        Obstetric Comments  One child passed away remote from birth due to drowning.         Social History: Social History   Socioeconomic History  .  Marital status: Unknown    Spouse name: Not on file  . Number of children: Not on file  . Years of education: Not on file  . Highest education level: Not on file  Occupational History  . Not on file  Social Needs  . Financial resource strain: Not on file  . Food insecurity:    Worry: Not on file    Inability: Not on file  . Transportation needs:    Medical: Not on file    Non-medical: Not on file  Tobacco Use  . Smoking status: Never Smoker  . Smokeless tobacco: Never Used  Substance and Sexual Activity  . Alcohol use: No  . Drug use: No  . Sexual activity: Yes    Birth control/protection: None  Lifestyle  . Physical activity:    Days per week: Not on  file    Minutes per session: Not on file  . Stress: Not on file  Relationships  . Social connections:    Talks on phone: Not on file    Gets together: Not on file    Attends religious service: Not on file    Active member of club or organization: Not on file    Attends meetings of clubs or organizations: Not on file    Relationship status: Not on file  Other Topics Concern  . Not on file  Social History Narrative  . Not on file    Family History: Family History  Problem Relation Age of Onset  . Hypertension Mother     Allergies: No Known Allergies  Medications Prior to Admission  Medication Sig Dispense Refill Last Dose  . Prenatal Vit-Fe Fumarate-FA (PREPLUS) 27-1 MG TABS Take 1 tablet by mouth daily. 30 tablet 11 Taking     Review of Systems   All systems reviewed and negative except as stated in HPI  Blood pressure 107/67, pulse 78, temperature 97.8 F (36.6 C), temperature source Oral, resp. rate 20, height 5\' 10"  (1.778 m), weight 75.1 kg (165 lb 8 oz), last menstrual period 10/08/2016, SpO2 100 %, unknown if currently breastfeeding. General appearance: alert and cooperative Lungs: clear to auscultation bilaterally Heart: regular rate and rhythm Abdomen: soft, non-tender; bowel sounds  normal Extremities: Homans sign is negative, no sign of DVT Presentation: cephalic Fetal monitoringBaseline: 140 bpm, Variability: Fair (1-6 bpm), Accelerations: Non-reactive but appropriate for gestational age and Decelerations: Absent Uterine activityNone Dilation: 5 Effacement (%): 90 Station: -2 Exam by:: jaton burgess rnc   Prenatal labs: ABO, Rh: O/Positive/-- (11/27 1610) Antibody: Negative (11/27 0842) Rubella: 4.56 (11/27 0842) RPR: Non Reactive (01/08 1212)  HBsAg: Negative (11/27 0842)  HIV: Non Reactive (01/08 1212)  GBS:   negative 1 hr Glucola 88 Genetic screening  normal Anatomy US: slight pyelectasis left kidney --> resolved  Prenatal Transfer Tool  Maternal Diabetes: No Genetic Screening: Normal Maternal Ultrasounds/Referrals: Abnormal:  Findings:   Fetal Kidney Anomalies (resolved) Fetal Ultrasounds or other Referrals:  None Maternal Substance Abuse:  No Significant Maternal Medications:  None Significant Maternal Lab Results: None  Results for orders placed or performed during the hospital encounter of 08/23/17 (from the past 24 hour(s))  CBC   Collection Time: 08/23/17 10:08 AM  Result Value Ref Range   WBC 7.8 4.0 - 10.5 K/uL   RBC 4.10 3.87 - 5.11 MIL/uL   Hemoglobin 11.8 (L) 12.0 - 15.0 g/dL   HCT 96.0 (L) 45.4 - 09.8 %   MCV 85.9 78.0 - 100.0 fL   MCH 28.8 26.0 - 34.0 pg   MCHC 33.5 30.0 - 36.0 g/dL   RDW 11.9 14.7 - 82.9 %   Platelets 159 150 - 400 K/uL    Patient Active Problem List   Diagnosis Date Noted  . Hx of macrosomia in infant in prior pregnancy, currently pregnant, third trimester 06/14/2017  . Unwanted fertility 06/14/2017  . Pain in symphysis pubis during pregnancy 05/31/2017  . Round ligament pain 05/31/2017  . Supervision of other normal pregnancy, antepartum 04/19/2017  . Late prenatal care affecting pregnancy in second trimester 04/19/2017    Assessment/Plan:  Emily Best is a 27 y.o. F6O1308 at [redacted]w[redacted]d here for  labor. Had a very normal ob course. Slight pyelectasis left kidney, improved on repeat Ultrasound. Starting on pitocin. Reassuring strip at this point.  Labor:expectant management #Pain:Per maternal preference #FWB:Category I #ID:GBS negative #MOF:breast #  MOC:BTL #Circ:N/A  Myrene Buddy, MD  08/23/2017, 10:28 AM   OB FELLOW HISTORY AND PHYSICAL ATTESTATION  I have seen and examined this patient; I agree with above documentation in the resident's note.  SOL. Expecantant management GBS neg FHT Cat I Wants ppBTL (consnet singed)   Frederik Pear, MD OB Fellow 08/23/2017,

## 2017-08-24 ENCOUNTER — Encounter (HOSPITAL_COMMUNITY): Payer: Self-pay | Admitting: Family Medicine

## 2017-08-24 LAB — RPR: RPR: NONREACTIVE

## 2017-08-24 NOTE — Progress Notes (Signed)
POSTPARTUM PROGRESS NOTE  Post Partum Day 1/POD#1  Subjective:  Emily Best is a 27 y.o. [phone] s/p SVD at 5880w2d, and ppBTL yesterday.  No acute events overnight. Pt reports she is doing well, voiding/ambulating/eating w/o difficulties. Denies any concerns.   Objective: Blood pressure 98/60, pulse 72, temperature 98.6 F (37 C), temperature source Oral, resp. rate 17, height 5\' 10"  (1.778 m), weight 165 lb 8 oz (75.1 kg), last menstrual period 10/08/2016, SpO2 99 %, unknown if currently breastfeeding.  Physical Exam:  General: alert, cooperative and no distress Chest: no respiratory distress Heart:regular rate, distal pulses intact Abdomen: soft, nontender,  Uterine Fundus: firm, appropriately tender DVT Evaluation: No calf swelling or tenderness Extremities: trace edema Skin: warm, dry  Recent Labs    08/23/17 1008  HGB 11.8*  HCT 35.2*    Assessment/Plan: Emily Best is a 27 y.o. [id number] s/p SVD/ppBTL at 5980w2d   PPD#1: routine pp care Contraception: s/p BTL  Feeding: breast Dispo: Plan for discharge tomorrow.   LOS: 1 day   Kandra NicolasJulie P DegeleMD 08/24/2017, 8:14 AM

## 2017-08-24 NOTE — Anesthesia Postprocedure Evaluation (Signed)
Anesthesia Post Note  Patient: Emily BoschDominique Best  Procedure(s) Performed: POST PARTUM TUBAL LIGATION (N/A )     Patient location during evaluation: PACU Anesthesia Type: General Level of consciousness: awake and alert and oriented Pain management: pain level controlled Vital Signs Assessment: post-procedure vital signs reviewed and stable Respiratory status: spontaneous breathing, nonlabored ventilation and respiratory function stable Cardiovascular status: blood pressure returned to baseline and stable Postop Assessment: no apparent nausea or vomiting Anesthetic complications: no                  Krishna Heuer A.

## 2017-08-24 NOTE — Anesthesia Postprocedure Evaluation (Signed)
Anesthesia Post Note  Patient: Emily Best  Procedure(s) Performed: POST PARTUM TUBAL LIGATION (N/A )     Patient location during evaluation: Mother Baby Anesthesia Type: General Level of consciousness: awake Pain management: satisfactory to patient Vital Signs Assessment: post-procedure vital signs reviewed and stable Respiratory status: spontaneous breathing Cardiovascular status: stable Anesthetic complications: no    Last Vitals:  Vitals:   08/24/17 0535 08/24/17 1000  BP: 98/60 (!) 97/53  Pulse: 72 70  Resp: 17   Temp: 37 C 36.4 C  SpO2: 99%     Last Pain:  Vitals:   08/24/17 1143  TempSrc:   PainSc: 4    Pain Goal: Patients Stated Pain Goal: 4 (08/23/17 1920)               Cephus ShellingBURGER,Leimomi Zervas

## 2017-08-24 NOTE — Addendum Note (Signed)
Addendum  created 08/24/17 1420 by Algis GreenhouseBurger, Janiaya Ryser A, CRNA   Sign clinical note

## 2017-08-25 ENCOUNTER — Encounter (HOSPITAL_COMMUNITY): Payer: Self-pay

## 2017-08-25 MED ORDER — IBUPROFEN 600 MG PO TABS: 600.0000 mg | ORAL_TABLET | Freq: Four times a day (QID) | ORAL | 0 refills | Status: AC

## 2017-08-25 MED ORDER — OXYCODONE HCL 5 MG PO TABS: 5.0000 mg | ORAL_TABLET | ORAL | 0 refills | Status: AC | PRN

## 2017-08-25 NOTE — Discharge Instructions (Signed)
Vaginal Delivery, Care After °Refer to this sheet in the next few weeks. These instructions provide you with information about caring for yourself after vaginal delivery. Your health care provider may also give you more specific instructions. Your treatment has been planned according to current medical practices, but problems sometimes occur. Call your health care provider if you have any problems or questions. °What can I expect after the procedure? °After vaginal delivery, it is common to have: °· Some bleeding from your vagina. °· Soreness in your abdomen, your vagina, and the area of skin between your vaginal opening and your anus (perineum). °· Pelvic cramps. °· Fatigue. ° °Follow these instructions at home: °Medicines °· Take over-the-counter and prescription medicines only as told by your health care provider. °· If you were prescribed an antibiotic medicine, take it as told by your health care provider. Do not stop taking the antibiotic until it is finished. °Driving ° °· Do not drive or operate heavy machinery while taking prescription pain medicine. °· Do not drive for 24 hours if you received a sedative. °Lifestyle °· Do not drink alcohol. This is especially important if you are breastfeeding or taking medicine to relieve pain. °· Do not use tobacco products, including cigarettes, chewing tobacco, or e-cigarettes. If you need help quitting, ask your health care provider. °Eating and drinking °· Drink at least 8 eight-ounce glasses of water every day unless you are told not to by your health care provider. If you choose to breastfeed your baby, you may need to drink more water than this. °· Eat high-fiber foods every day. These foods may help prevent or relieve constipation. High-fiber foods include: °? Whole grain cereals and breads. °? Brown rice. °? Beans. °? Fresh fruits and vegetables. °Activity °· Return to your normal activities as told by your health care provider. Ask your health care provider  what activities are safe for you. °· Rest as much as possible. Try to rest or take a nap when your baby is sleeping. °· Do not lift anything that is heavier than your baby or 10 lb (4.5 kg) until your health care provider says that it is safe. °· Talk with your health care provider about when you can engage in sexual activity. This may depend on your: °? Risk of infection. °? Rate of healing. °? Comfort and desire to engage in sexual activity. °Vaginal Care °· If you have an episiotomy or a vaginal tear, check the area every day for signs of infection. Check for: °? More redness, swelling, or pain. °? More fluid or blood. °? Warmth. °? Pus or a bad smell. °· Do not use tampons or douches until your health care provider says this is safe. °· Watch for any blood clots that may pass from your vagina. These may look like clumps of dark red, brown, or black discharge. °General instructions °· Keep your perineum clean and dry as told by your health care provider. °· Wear loose, comfortable clothing. °· Wipe from front to back when you use the toilet. °· Ask your health care provider if you can shower or take a bath. If you had an episiotomy or a perineal tear during labor and delivery, your health care provider may tell you not to take baths for a certain length of time. °· Wear a bra that supports your breasts and fits you well. °· If possible, have someone help you with household activities and help care for your baby for at least a few days after   you leave the hospital. °· Keep all follow-up visits for you and your baby as told by your health care provider. This is important. °Contact a health care provider if: °· You have: °? Vaginal discharge that has a bad smell. °? Difficulty urinating. °? Pain when urinating. °? A sudden increase or decrease in the frequency of your bowel movements. °? More redness, swelling, or pain around your episiotomy or vaginal tear. °? More fluid or blood coming from your episiotomy or  vaginal tear. °? Pus or a bad smell coming from your episiotomy or vaginal tear. °? A fever. °? A rash. °? Little or no interest in activities you used to enjoy. °? Questions about caring for yourself or your baby. °· Your episiotomy or vaginal tear feels warm to the touch. °· Your episiotomy or vaginal tear is separating or does not appear to be healing. °· Your breasts are painful, hard, or turn red. °· You feel unusually sad or worried. °· You feel nauseous or you vomit. °· You pass large blood clots from your vagina. If you pass a blood clot from your vagina, save it to show to your health care provider. Do not flush blood clots down the toilet without having your health care provider look at them. °· You urinate more than usual. °· You are dizzy or light-headed. °· You have not breastfed at all and you have not had a menstrual period for 12 weeks after delivery. °· You have stopped breastfeeding and you have not had a menstrual period for 12 weeks after you stopped breastfeeding. °Get help right away if: °· You have: °? Pain that does not go away or does not get better with medicine. °? Chest pain. °? Difficulty breathing. °? Blurred vision or spots in your vision. °? Thoughts about hurting yourself or your baby. °· You develop pain in your abdomen or in one of your legs. °· You develop a severe headache. °· You faint. °· You bleed from your vagina so much that you fill two sanitary pads in one hour. °This information is not intended to replace advice given to you by your health care provider. Make sure you discuss any questions you have with your health care provider. °Document Released: 05/07/2000 Document Revised: 10/22/2015 Document Reviewed: 05/25/2015 °Elsevier Interactive Patient Education © 2018 [employer] ° ° °Postpartum Tubal Ligation, Care After °Refer to this sheet in the next few weeks. These instructions provide you with information about caring for yourself after your procedure. Your health  care provider may also give you more specific instructions. Your treatment has been planned according to current medical practices, but problems sometimes occur. Call your health care provider if you have any problems or questions after your procedure. °What can I expect after the procedure? °After the procedure, it is common to have: °· A sore throat. °· Bruising or pain in your back. °· Nausea or vomiting. °· Dizziness. °· Mild abdominal discomfort or pain, such as cramping, gas pain, or feeling bloated. °· Soreness where the incision was made. °· Tiredness. °· Pain in your shoulders. ° °Follow these instructions at home: °Medicines °· Take over-the-counter and prescription medicines only as told by your health care provider. °· Do not take aspirin because it can cause bleeding. °· Do not drive or operate heavy machinery while taking prescription pain medicine. °Activity °· Rest for the rest of the day. °· Gradually return to your normal activities over the next few days. °· Do not have sex, douche,   or put a tampon or anything else in your vagina for 6 weeks or as long as told by your health care provider. °· Do not lift anything that is heavier than your baby for 2 weeks or as long as told by your health care provider. °Incision care °· Follow instructions from your health care provider about how to take care of your incision. Make sure you: °? Wash your hands with soap and water before you change your bandage (dressing). If soap and water are not available, use hand sanitizer. °? Change your dressing as told by your health care provider. °? Leave stitches (sutures) in place. They may need to stay in place for 2 weeks or longer. °· Check your incision area every day for signs of infection. Check for: °? More redness, swelling, or pain. °? More fluid or blood. °? Warmth. °? Pus or a bad smell. °Other Instructions °· Do not take baths, swim, or use a hot tub until your health care provider approves. You may take  showers. °· Keep all follow-up visits as told by your health care provider. This is important. °Contact a health care provider if: °· You have more redness, swelling, or pain around your incision. °· Your incision feels warm to the touch. °· You have pus or a bad smell coming from your incision. °· The edges of your incision break open after the sutures have been removed. °· Your pain does not improve after 2-3 days. °· You have a rash. °· You repeatedly become dizzy or lightheaded. °· Your pain medicine is not helping. °· You are constipated. °Get help right away if: °· You have a fever. °· You faint. °· You have pain in your abdomen that gets worse. °· You have fluid or blood coming from your sutures. °· You have shortness of breath or difficulty breathing. °· You have chest pain or leg pain. °· You have ongoing nausea or diarrhea. °This information is not intended to replace advice given to you by your health care provider. Make sure you discuss any questions you have with your health care provider. °Document Released: 11/09/2011 Document Revised: 10/13/2015 Document Reviewed: 04/20/2015 °Elsevier Interactive Patient Education © 2018 [employer] ° °

## 2017-08-25 NOTE — Discharge Summary (Signed)
OB Discharge Summary     Patient Name: Emily Best DOB: 09/23/1990 MRN: [id number]  Date of admission: 08/23/2017 Delivering MD: Frederik Pear   Date of discharge: 08/25/2017  Admitting diagnosis: LABOR Intrauterine pregnancy: [redacted]w[redacted]d     Secondary diagnosis:  Active Problems:   NSVD (normal spontaneous vaginal delivery)   Family history of Rubenstein-Taybi syndrome (son)  Additional problems: Undesired Fertility     Discharge diagnosis: Term Pregnancy Delivered and Undesired Fertility                                                                                                Post partum procedures:postpartum tubal ligation  Augmentation: AROM  Complications: None  Hospital course:  Onset of Labor With Vaginal Delivery     27 y.o. yo [id number] at [redacted]w[redacted]d was admitted in Latent Labor on 08/23/2017. Patient had an uncomplicated labor course as follows:  Membrane Rupture Time/Date: 1:59 PM ,08/23/2017   Intrapartum Procedures: Episiotomy: None [1]                                         Lacerations:  None [1]  Patient had a delivery of a Viable infant. 08/23/2017  Information for the patient's newborn:  [place], Bowlds [[id number]]  Delivery Method: Vaginal, Spontaneous(Filed from Delivery Summary)    Pateint had an uncomplicated postpartum course.  She is ambulating, tolerating a regular diet, passing flatus, and urinating well. Patient is discharged home in stable condition on 08/25/17.   Physical exam  Vitals:   08/24/17 0909 08/24/17 1000 08/24/17 1754 08/25/17 0536  BP:  (!) 97/53 94/60 98/63   Pulse:  70 68 72  Resp:   17 18  Temp:  97.6 F (36.4 C)  97.8 F (36.6 C)  TempSrc:  Oral  Oral  SpO2:      Weight: 156 lb 11.2 oz (71.1 kg)     Height:       General: alert, cooperative and no distress Lochia: appropriate Uterine Fundus: firm Incision: Healing well with no significant drainage, Dressing is clean, dry, and intact DVT Evaluation: No evidence of DVT  seen on physical exam. Labs: Lab Results  Component Value Date   WBC 7.8 08/23/2017   HGB 11.8 (L) 08/23/2017   HCT 35.2 (L) 08/23/2017   MCV 85.9 08/23/2017   PLT 159 08/23/2017   No flowsheet data found.  Discharge instruction: per After Visit Summary and "Baby and Me Booklet".  After visit meds:  Allergies as of 08/25/2017   No Known Allergies     Medication List    TAKE these medications   ibuprofen 600 MG tablet Commonly known as:  ADVIL,MOTRIN Take 1 tablet (600 mg total) by mouth every 6 (six) hours.   oxyCODONE 5 MG immediate release tablet Commonly known as:  Oxy IR/ROXICODONE Take 1 tablet (5 mg total) by mouth every 4 (four) hours as needed for severe pain.   PREPLUS 27-1 MG Tabs Take 1 tablet by mouth daily.  Diet: routine diet  Activity: Advance as tolerated. Pelvic rest for 6 weeks.   Outpatient follow up:2 weeks Follow up Appt:No future appointments. Follow up Visit:No follow-ups on file.  Postpartum contraception: Tubal Ligation  Newborn Data: Live born female  Birth Weight: 8 lb 12 oz (3970 g) APGAR: 8, 9  Newborn Delivery   Birth date/time:  08/23/2017 15:30:00 Delivery type:  Vaginal, Spontaneous     Baby Feeding: Breast Disposition:home with mother   08/25/2017 Emily Best, CNM

## 2017-08-28 ENCOUNTER — Inpatient Hospital Stay (HOSPITAL_COMMUNITY): Admission: RE | Admit: 2017-08-28 | Payer: Medicaid Other | Source: Ambulatory Visit

## 2017-09-05 ENCOUNTER — Ambulatory Visit (INDEPENDENT_AMBULATORY_CARE_PROVIDER_SITE_OTHER): Payer: Medicaid Other | Admitting: General Practice

## 2017-09-05 VITALS — BP 119/82 | HR 58 | Ht 70.0 in | Wt 148.0 lb

## 2017-09-05 DIAGNOSIS — Z5189 Encounter for other specified aftercare: Secondary | ICD-10-CM

## 2017-09-05 NOTE — Progress Notes (Signed)
Patient here for wound check today following BTL on 4/2. Incision is clean, dry, & Intact. Patient will follow up at scheduled pp visit. Patient had no questions.

## 2017-09-10 IMAGING — US US MFM OB COMP +14 WKS
1 series · 14 of 28 positions shown · non-contrast
Comparison: none

[Series 1: us mfm ob comp +14 wks · 14 of 79 slices shown]
[im 3/79]
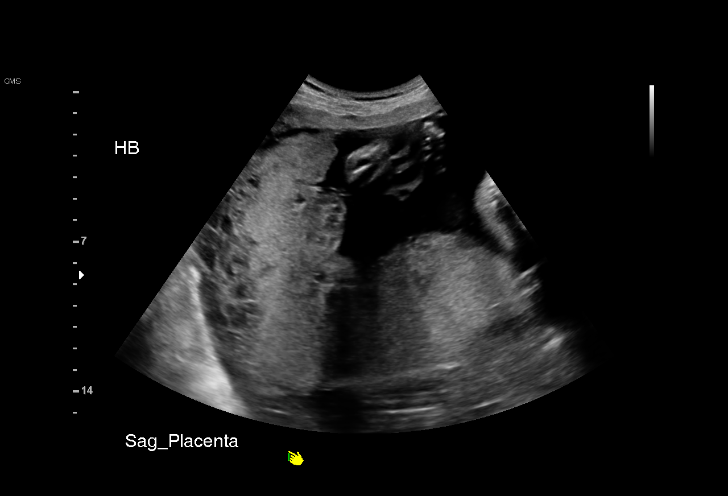
[im 9/79]
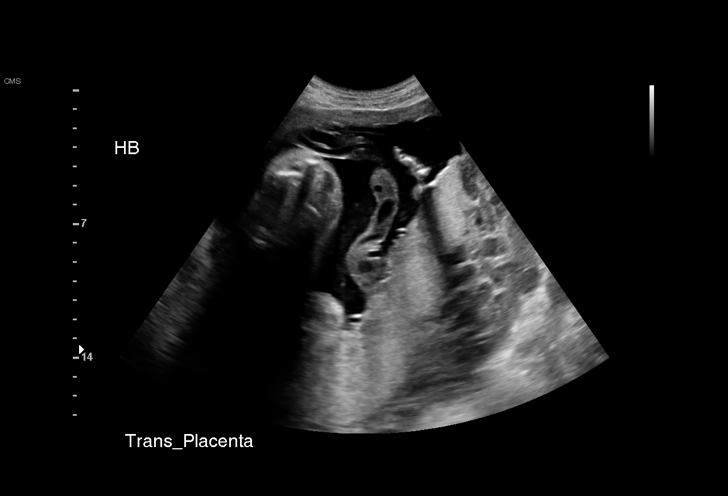
[im 15/79]
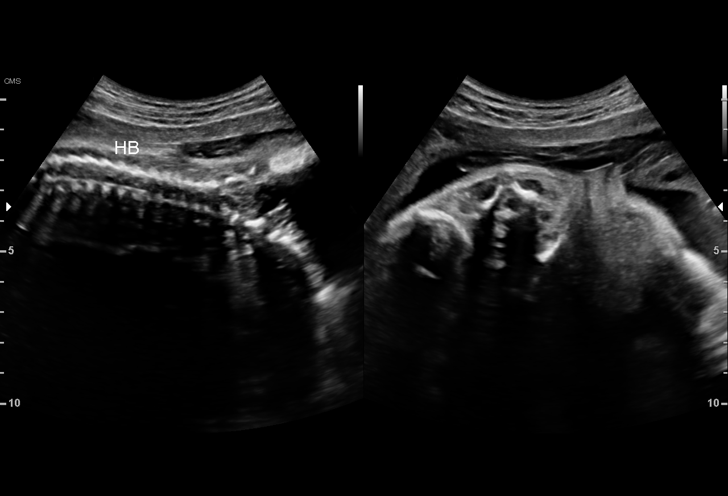
[im 21/79]
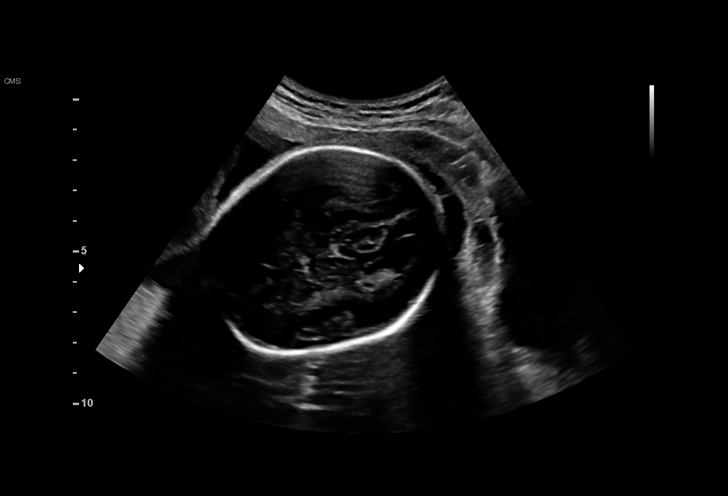
[im 27/79]
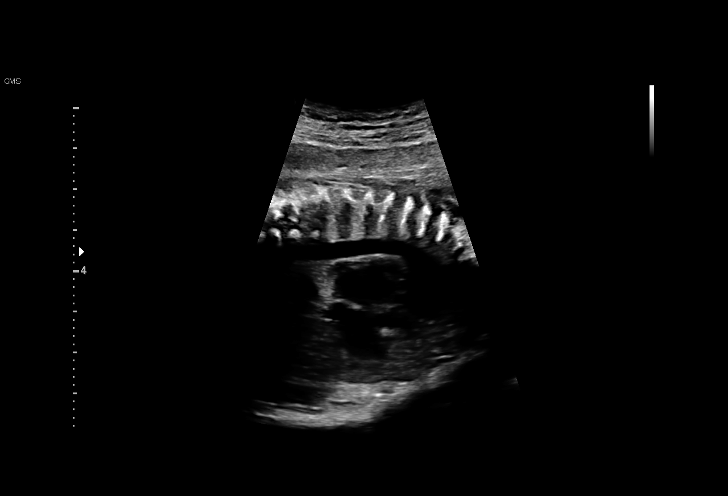
[im 32/79]
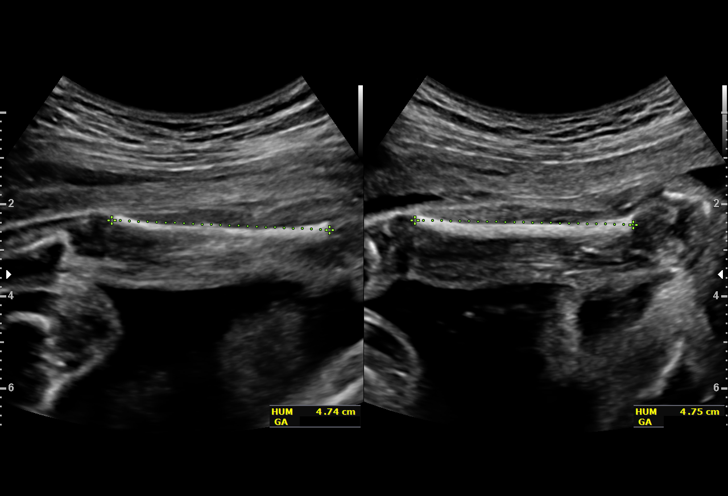
[im 38/79]
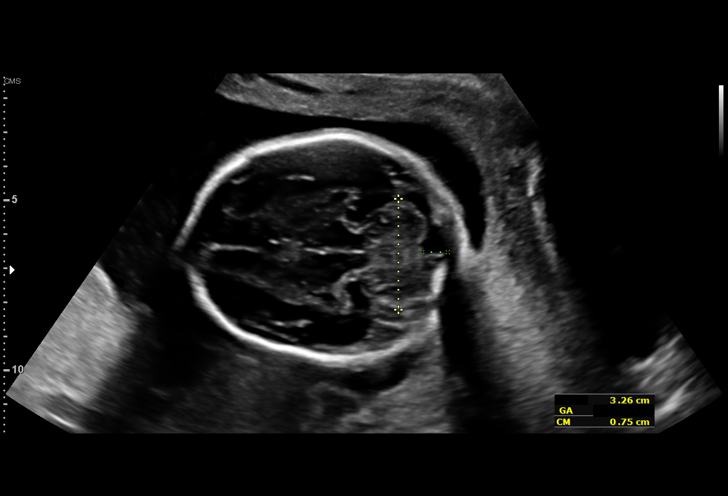
[im 44/79]
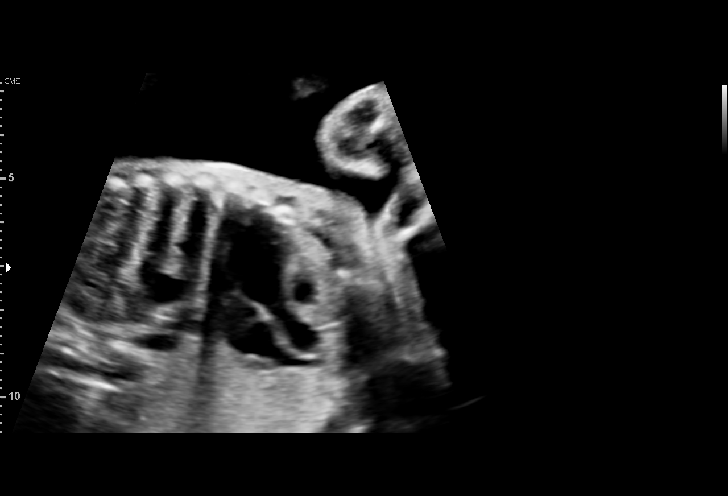
[im 50/79]
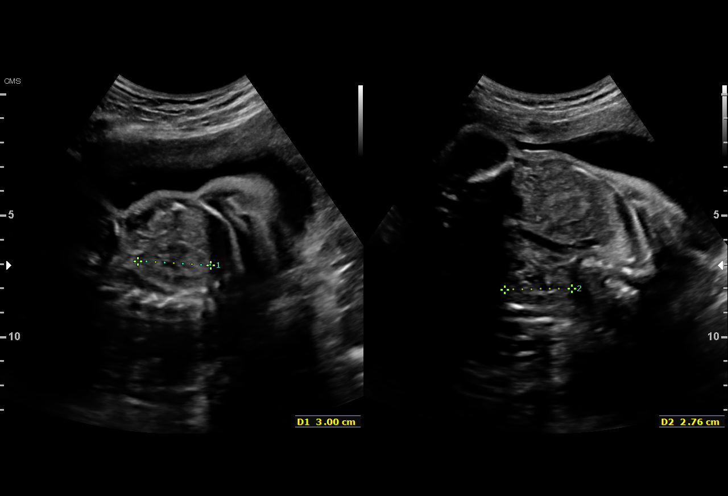
[im 55/79]
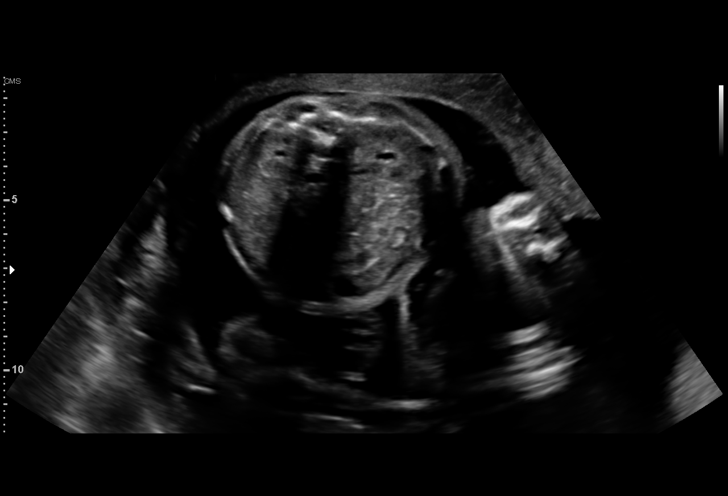
[im 61/79]
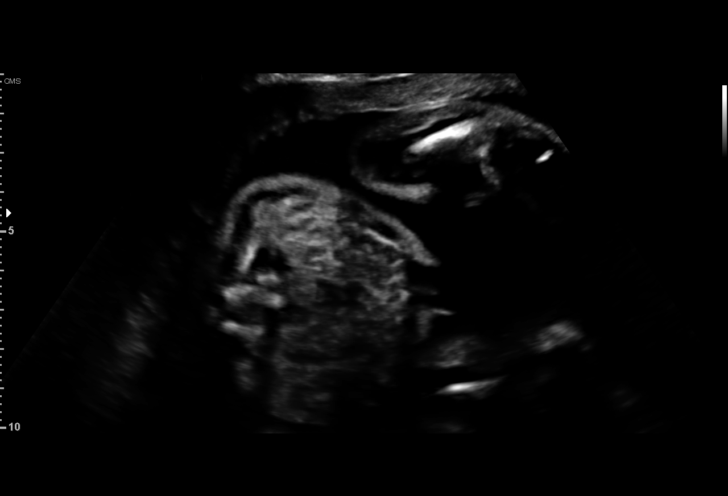
[im 67/79]
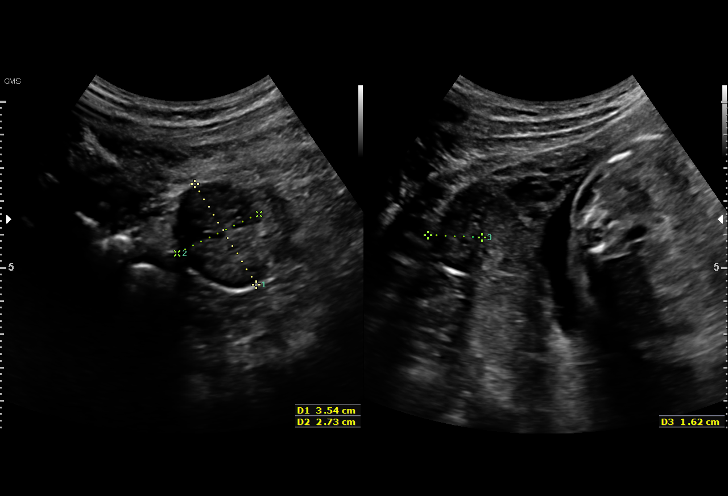
[im 73/79]
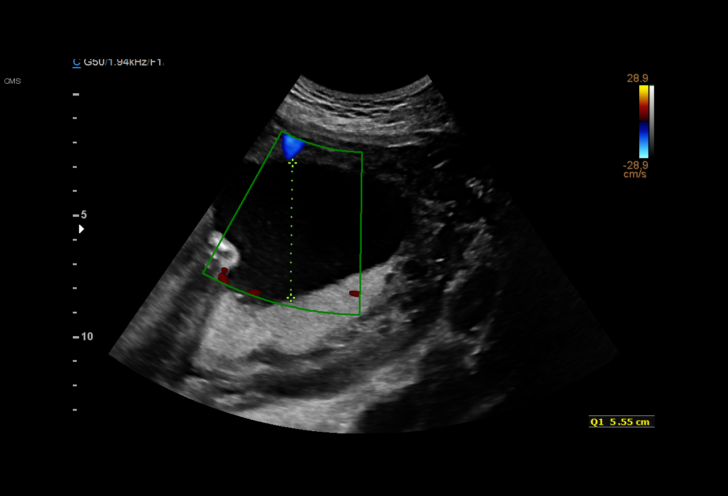
[im 79/79]
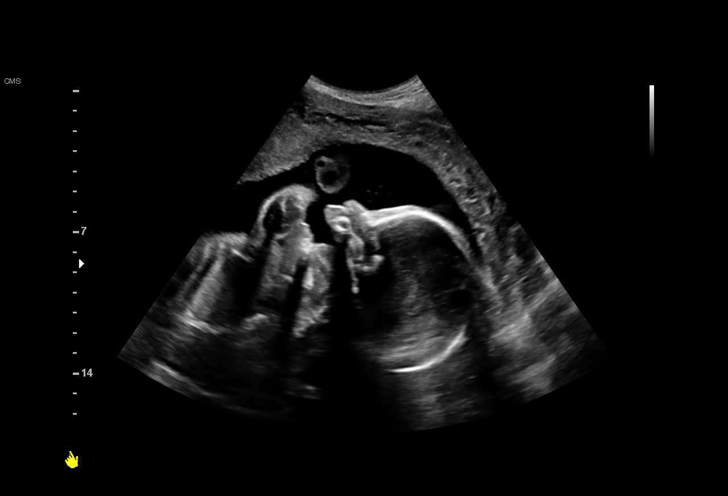

[14 of 28 positions shown; findings below may reference images not displayed]

Indications

28 weeks gestation of pregnancy
Antenatal screening for malformations
OB History

Gravidity:    3         Term:   2        Prem:   0        SAB:   0
TOP:          0       Ectopic:  0        Living: 1
Fetal Evaluation

Num Of Fetuses:     1
Fetal Heart         140
Rate(bpm):
Cardiac Activity:   Observed
Presentation:       Cephalic
Placenta:           Posterior, above cervical os
P. Cord Insertion:  Visualized

Amniotic Fluid
AFI FV:      Subjectively within normal limits

AFI Sum(cm)     %Tile       Largest Pocket(cm)
19.8            79

RUQ(cm)       RLQ(cm)       LUQ(cm)        LLQ(cm)
5.55
Biometry

BPD:      65.5  mm     G. Age:  26w 3d          4  %    CI:        73.52   %   70 - 86
FL/HC:      21.2   %   18.8 -
HC:      242.7  mm     G. Age:  26w 3d        < 3  %    HC/AC:      1.06       1.05 -
AC:      228.3  mm     G. Age:  27w 2d         21  %    FL/BPD:     78.6   %   71 - 87
FL:       51.5  mm     G. Age:  27w 4d         23  %    FL/AC:      22.6   %   20 - 24
HUM:      47.4  mm     G. Age:  27w 6d         43  %
CER:      32.5  mm     G. Age:  28w 4d         56  %

CM:        7.5  mm
Est. FW:    9170  gm      2 lb 5 oz     34  %
Gestational Age

Clinical EDD:  28w 0d                                        EDD:   05/17/16
U/S Today:     27w 0d                                        EDD:   05/24/16
Best:          28w 0d    Det. By:   Clinical EDD             EDD:   05/17/16
Anatomy

Cranium:               Appears normal         Aortic Arch:            Appears normal
Cavum:                 Appears normal         Ductal Arch:            Appears normal
Ventricles:            Appears normal         Diaphragm:              Appears normal
Choroid Plexus:        Appears normal         Stomach:                Appears normal, left
sided
Cerebellum:            Appears normal         Abdomen:                Appears normal
Posterior Fossa:       Appears normal         Abdominal Wall:         Appears nml (cord
insert, abd wall)
Nuchal Fold:           Not applicable (>20    Cord Vessels:           Appears normal (3
wks GA)                                        vessel cord)
Face:                  Appears normal         Kidneys:                Appear normal
(orbits and profile)
Lips:                  Appears normal         Bladder:                Appears normal
Thoracic:              Appears normal         Spine:                  Appears normal
Heart:                 Appears normal         Upper Extremities:      Appears normal
(4CH, axis, and
situs)
RVOT:                  Not well visualized    Lower Extremities:      Appears normal
LVOT:                  Appears normal

Other:  Fetus appears to be a male.
Cervix Uterus Adnexa

Cervix
Length:           4.24  cm.
Normal appearance by transabdominal scan.

Uterus
No abnormality visualized.

Left Ovary
Within normal limits.

Right Ovary
Within normal limits.

Cul De Sac:   No free fluid seen.

Adnexa:       No abnormality visualized.
Impression

SIUP at 28+0 weeks
Normal detailed fetal anatomy; limited views of RVOT
Normal amniotic fluid volume
Measurements consistent with stated EDC; EFW at the 34th
%tile
Recommendations

Follow-up as clinically indicated

## 2017-10-03 ENCOUNTER — Ambulatory Visit: Payer: Medicaid Other | Admitting: Advanced Practice Midwife

## 2017-10-03 NOTE — Progress Notes (Signed)
Per Heather Hogan, CNM pt does not need to be contacted in regards to missed appt.  

## 2017-10-15 ENCOUNTER — Encounter (HOSPITAL_COMMUNITY): Payer: Self-pay | Admitting: Family Medicine

## 2019-01-06 IMAGING — US US MFM OB FOLLOW-UP
1 series · 12 of 28 positions shown · non-contrast
Comparison: none

[Series 1: us mfm ob follow-up · 12 of 46 slices shown]
[im 2/46]
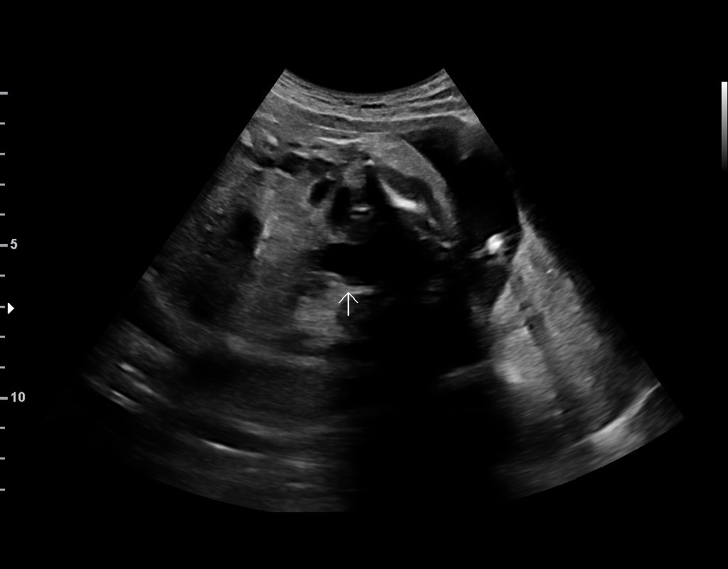
[im 6/46]
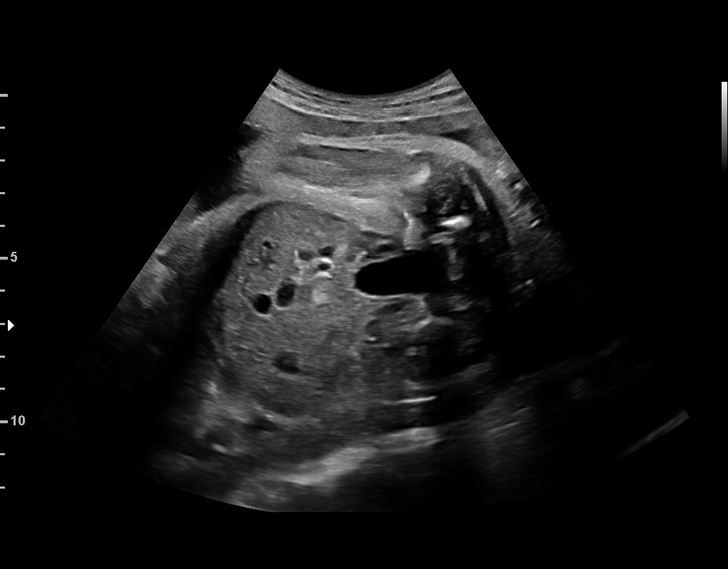
[im 9/46]
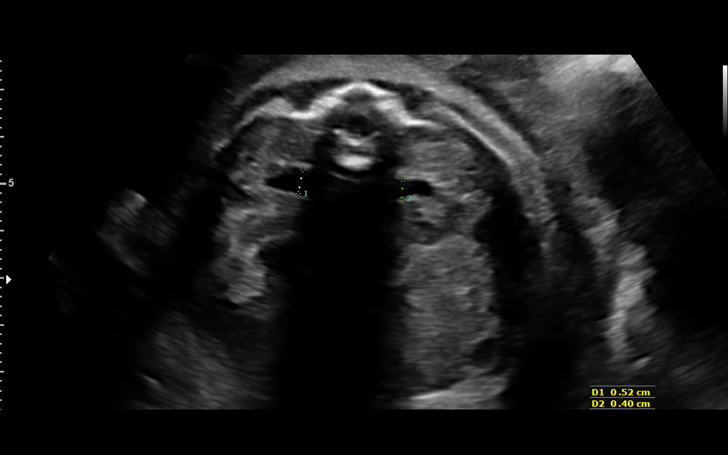
[im 14/46]
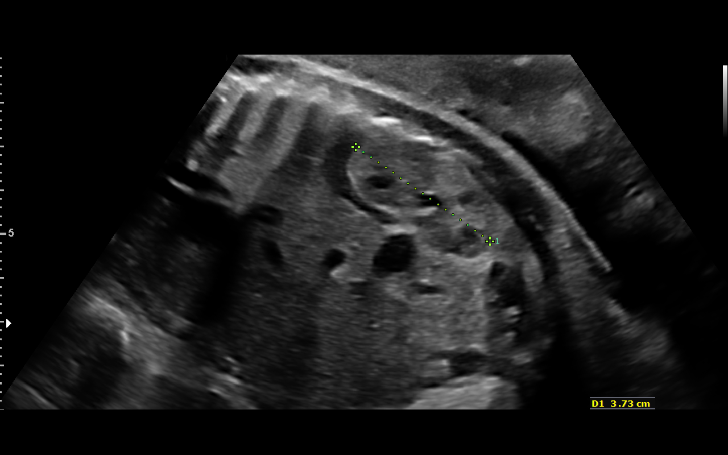
[im 17/46]
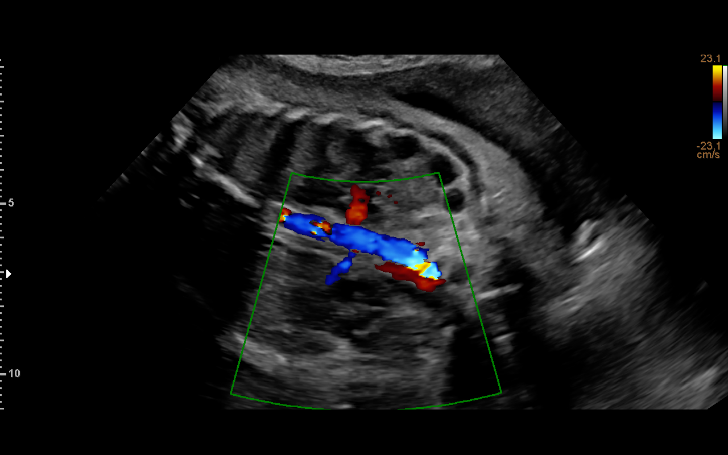
[im 21/46]
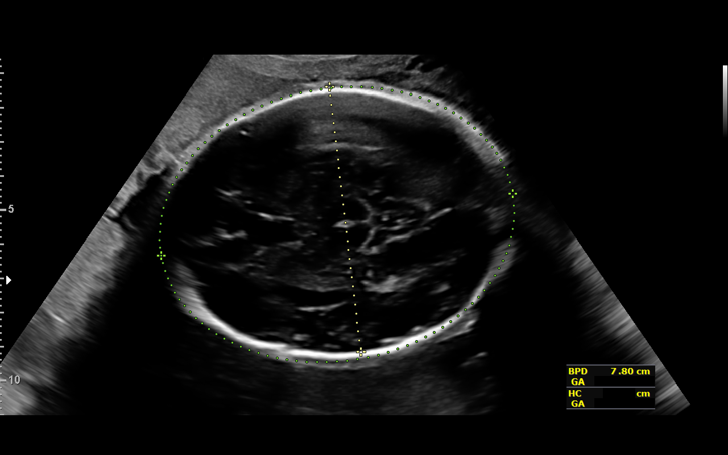
[im 26/46]
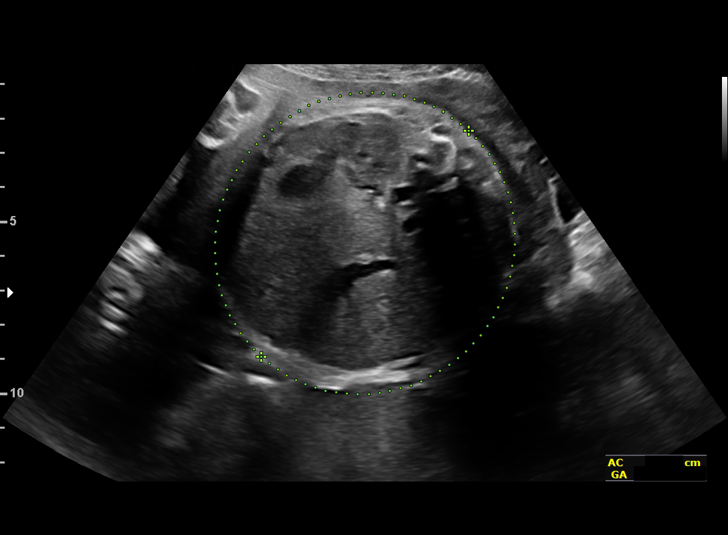
[im 29/46]
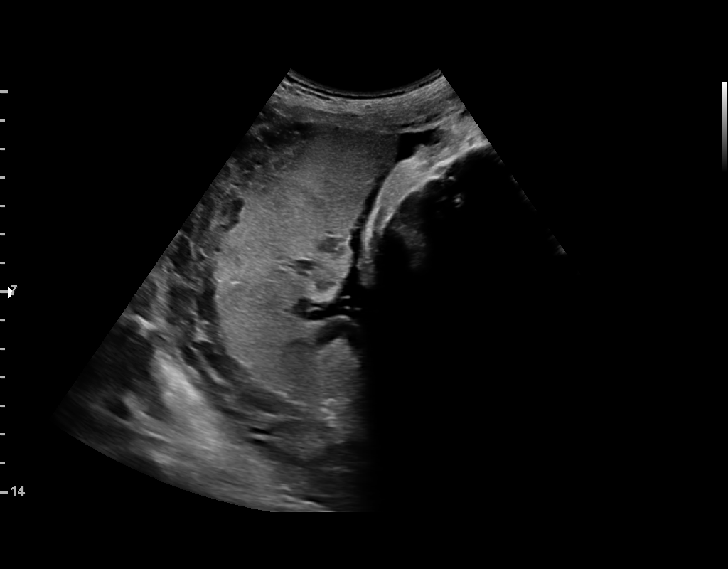
[im 32/46]
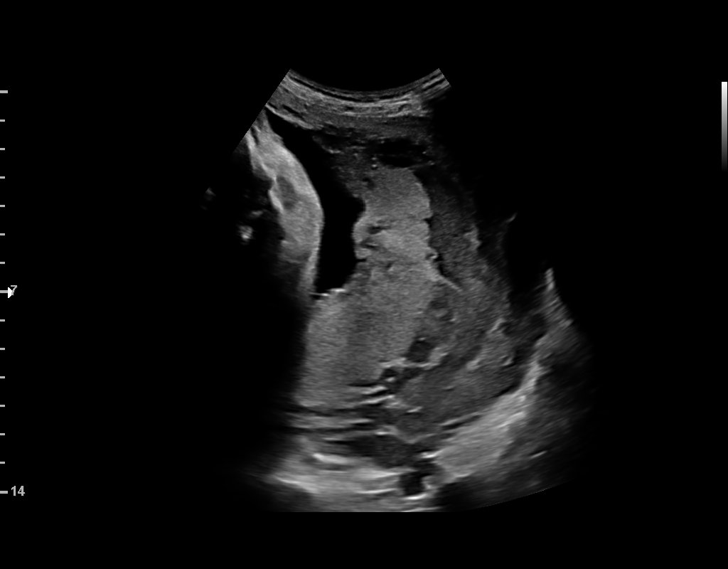
[im 37/46]
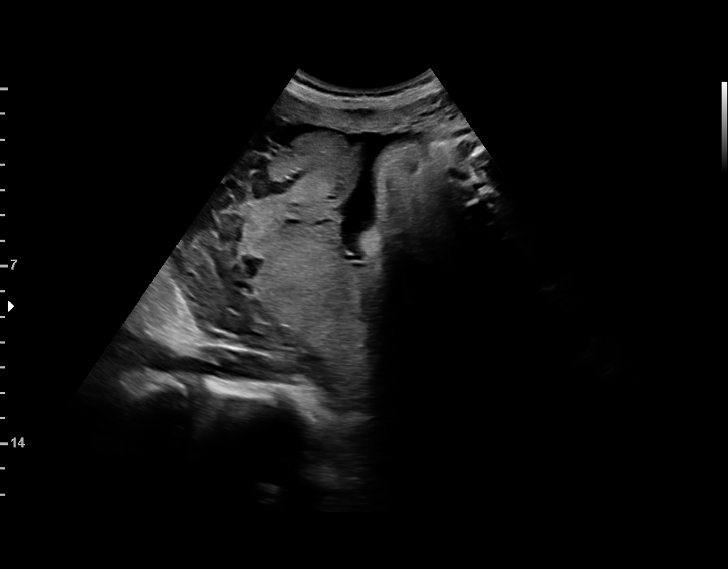
[im 41/46]
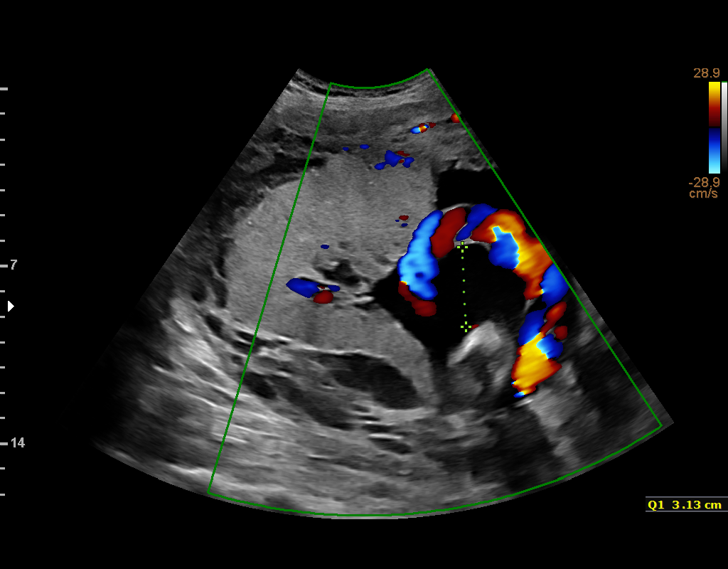
[im 44/46]
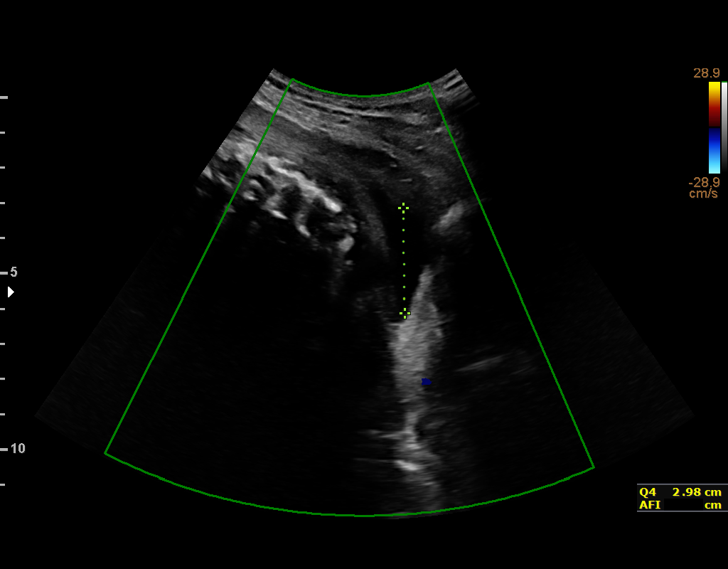

[12 of 28 positions shown; findings below may reference images not displayed]

OB/Gyn Clinic

Indications

31 weeks gestation of pregnancy
Encounter for other antenatal screening
follow-up
Fetal abnormality - other known or
suspected ( LT renal pyelectasis)
OB History

Blood Type:            Height:  5'10"  Weight (lb):  139       BMI:
Gravidity:    4         Term:   3        Prem:   0        SAB:   0
TOP:          0       Ectopic:  0        Living: 2
Fetal Evaluation

Num Of Fetuses:     1
Fetal Heart         131
Rate(bpm):
Cardiac Activity:   Observed
Presentation:       Breech
Placenta:           Fundal, above cervical os
P. Cord Insertion:  Previously Visualized

Amniotic Fluid
AFI FV:      Subjectively within normal limits

AFI Sum(cm)     %Tile       Largest Pocket(cm)
10.99           23
RUQ(cm)       RLQ(cm)       LUQ(cm)        LLQ(cm)
3.13
Biometry

BPD:      77.4  mm     G. Age:  31w 0d         36  %    CI:        71.83   %    70 - 86
FL/HC:      21.3   %    19.3 -
HC:      290.7  mm     G. Age:  32w 0d         38  %    HC/AC:      1.06        0.96 -
AC:      273.9  mm     G. Age:  31w 3d         56  %    FL/BPD:     80.0   %    71 - 87
FL:       61.9  mm     G. Age:  32w 0d         62  %    FL/AC:      22.6   %    20 - 24
HUM:        58  mm     G. Age:  33w 4d       > 95  %

Est. FW:    1417  gm           4 lb     66  %
Gestational Age

U/S Today:     31w 4d                                        EDD:   08/18/17
Best:          31w 1d     Det. By:  U/S  (04/06/17)          EDD:   08/21/17
Anatomy

Cranium:               Appears normal         Aortic Arch:            Previously seen
Cavum:                 Appears normal         Ductal Arch:            Previously seen
Ventricles:            Appears normal         Diaphragm:              Previously seen
Choroid Plexus:        Previously seen        Stomach:                Appears normal, left
sided
Cerebellum:            Previously seen        Abdomen:                Previously seen
Posterior Fossa:       Previously seen        Abdominal Wall:         Previously seen
Nuchal Fold:           Previously seen        Cord Vessels:           Previously seen
Face:                  Orbits and profile     Kidneys:                Appear normal
previously seen
Lips:                  Previously seen        Bladder:                Appears normal
Thoracic:              Appears normal         Spine:                  Previously seen
Heart:                 Previously seen        Upper Extremities:      Previously seen
RVOT:                  Previously seen        Lower Extremities:      Previously seen
LVOT:                  Previously seen

Other:  Female gender. Heels previously visualized
Cervix Uterus Adnexa

Cervix
Not visualized (advanced GA >87wks)

Uterus
No abnormality visualized.

Left Ovary
Previously seen.

Right Ovary
Previously seen
Impression

SIUP at 31+1 weeks
Normal interval anatomy; anatomic survey complete; UTD
resolved
Normal amniotic fluid volume
Appropriate interval growth with EFW at the 66th %tile
Recommendations

Follow-up as clinically indicated
